# Patient Record
Sex: Male | Born: 1995
Health system: Southern US, Community
[De-identification: ages and names within clinical notes are randomized; demographics above are authoritative.]

## PROBLEM LIST (undated history)

## (undated) DIAGNOSIS — F319 Bipolar disorder, unspecified: Secondary | ICD-10-CM

## (undated) DIAGNOSIS — Z8659 Personal history of other mental and behavioral disorders: Secondary | ICD-10-CM

---
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
## 2017-01-01 NOTE — ED Notes (Signed)
Regular Diet was ordered for patient. 

## 2017-01-01 NOTE — ED Notes (Signed)
Regular Diet was ordered for Lunch. 
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
## 2017-01-01 NOTE — ED Notes (Signed)
Pt eating lunch tray  

## 2017-01-02 NOTE — ED Notes (Signed)
Breakfast tray ordered by Dee, NS 

## 2017-01-02 NOTE — ED Notes (Addendum)
Pt given saltines, peanut butter and sprite for snack

## 2017-01-02 NOTE — ED Notes (Signed)
Patient was given a snack and drink, A Regular diet was ordered for dinner.

## 2017-01-02 NOTE — ED Notes (Signed)
A cookies and a drink was placed at patient bedside.

## 2017-01-02 NOTE — ED Notes (Signed)
Regular Diet has been Ordered for Lunch. 

## 2017-01-02 NOTE — ED Notes (Signed)
Patient has received Lunch. 

## 2017-01-03 NOTE — ED Notes (Signed)
Pt ambulating to shower w/Sitter.

## 2017-01-04 NOTE — ED Notes (Addendum)
Pt given snack and sprite to drink 

## 2017-01-04 NOTE — ED Notes (Signed)
Lunch tray ordered 
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
## 2017-01-04 NOTE — ED Notes (Signed)
Lunch tray at bedside. ?

## 2017-01-05 DIAGNOSIS — F919 Conduct disorder, unspecified: Secondary | ICD-10-CM | POA: Diagnosis not present

## 2017-01-05 DIAGNOSIS — R4589 Other symptoms and signs involving emotional state: Secondary | ICD-10-CM | POA: Diagnosis not present

## 2017-01-05 DIAGNOSIS — R45851 Suicidal ideations: Secondary | ICD-10-CM | POA: Diagnosis not present

## 2017-01-05 DIAGNOSIS — F319 Bipolar disorder, unspecified: Secondary | ICD-10-CM | POA: Diagnosis not present

## 2017-01-05 MED ORDER — ZIPRASIDONE MESYLATE 20 MG IM SOLR
20.0000 mg | Freq: Once | INTRAMUSCULAR | Status: AC
  Administered 2017-01-05: 20 mg via INTRAMUSCULAR
  Filled 2017-01-05: qty 20

## 2017-01-05 MED ORDER — ACETAMINOPHEN 325 MG PO TABS
650.0000 mg | ORAL_TABLET | ORAL | Status: DC | PRN
  Administered 2017-01-05: 650 mg via ORAL
  Filled 2017-01-05: qty 2

## 2017-01-05 MED ORDER — STERILE WATER FOR INJECTION IJ SOLN
INTRAMUSCULAR | Status: AC
  Administered 2017-01-05: 07:00:00
  Filled 2017-01-05: qty 10

## 2017-01-05 NOTE — ED Notes (Signed)
Per Dahlia ClientHannah, SW, pt's [facility]. Advised pt should be receiving admission date by tomorrow (01/06/17) and hopefully will be able to be transported tomorrow.

## 2017-01-05 NOTE — ED Notes (Signed)
Pt to desk to ask for a pencil to draw with; this RN tried to give him crayons, and he refused.  A stray pencil was in the crayon box.  Pt fixated on pencil and wanted to draw with it.  To keep peace with this pt, he was given paper and pencil.

## 2017-01-05 NOTE — ED Notes (Signed)
Patient asking for trazodone at this time as another patient is disrupting the unit.

## 2017-01-05 NOTE — ED Notes (Addendum)
Pt asked for pencil back because he was twirling it in his fingers.  Pt saying he'd only give up pencil if someone took it from him.  Pt refused.  This RN informed patient of his plan of care (group home) and that doing anything drastic might change it.  Pt stated he didn't care.

## 2017-01-05 NOTE — ED Notes (Signed)
Pt lying on bed w/eyes closed. Respirations even, unlabored. Pt awakens easily. States "I feel fine." Pt able to move fingers/hands/wrists - normal color noted - no injury noted. Pt wearing burgundy paper scrubs. Pt noted w/hands in forensic restraints - [employer] at bedside. 24-Hour Emergency IVC paperwork completed by Dr Nicanor AlconPalumbo. Copy [facility] to [facility] - copy to Medical Records - Copy on clipboard - Original placed in folder for Magistrate.

## 2017-01-05 NOTE — ED Provider Notes (Signed)
Informed by nursing upon patient return that patient had eloped.  Neither Dr. Rhunette CroftNanavati nor I were aware that patient had eloped.  Brought back by [employer], informed patient was under IVC and this was renewed yesterday  Seen and examined  NCAT RRR CTAB NABS Alert    Christopher Barthold, MD 01/05/17 [phone]

## 2017-01-05 NOTE — ED Notes (Signed)
Pt resting quietly on bed w/eyes closed. Respirations even, unlabored. Pt able to move all extremities w/o difficulty when woke briefly by RN. Pt states "I feel fine".
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
## 2017-01-05 NOTE — ED Notes (Signed)
Pt ambulatory to F-01 w/Sitter.

## 2017-01-05 NOTE — ED Notes (Signed)
A Regular Diet was ordered for Lunch, and Patient was given a snack and drink. 
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
## 2017-01-05 NOTE — ED Notes (Signed)
Christopher Fermoina, NP, [facility] - aware of need for Telepsycy d/t pt now under 24-hour IVC.

## 2017-01-05 NOTE — ED Notes (Signed)
Christopher Foley, SW, called and updated RN on tx plan. Aware pt under Emergency (24-hour) IVC commitment - not 7-day IVC.

## 2017-01-05 NOTE — ED Notes (Addendum)
Forensic restraints removed.  Officers retrieved cuffs.

## 2017-01-05 NOTE — ED Notes (Signed)
Regular Diet was ordered for Dinner, and Patient was given a snack and drink.

## 2017-01-06 NOTE — Progress Notes (Signed)
CSW reached out to [place] via phone [phone]  from [facility] to get a update on placement for pt. A voicemail was left. CSW will continue to follow with needs.   Claude MangesKierra S. Alessandra Sawdey, MSW, LCSW-A Emergency Department Clinical Social Worker [phone]

## 2017-01-06 NOTE — Progress Notes (Signed)
CSW spoke with [facility]innie [facility]Gretchen at [facility] to get an update about pt. [name] informed CSW that she is still waiting for a call from the provider about an admission date and time.    Claude MangesKierra S. Epifanio Labrador, MSW, LCSW-A Emergency Department Clinical Social Worker [phone]

## 2017-01-07 NOTE — ED Notes (Signed)
Regular Diet was ordered for Dinner. 

## 2017-01-07 NOTE — ED Notes (Signed)
Patient was given Cookies and a Sprite. 

## 2017-01-07 NOTE — ED Notes (Signed)
Pt calm and cooperative, following treatment, sitter at the bedside.

## 2017-01-07 NOTE — ED Notes (Signed)
Dinner tray delivered to room. 

## 2017-01-07 NOTE — ED Notes (Signed)
Regular Diet was ordered for patient. 

## 2017-01-07 NOTE — ED Notes (Signed)
[facility]

## 2017-01-07 NOTE — ED Notes (Signed)
Pt's [place] start representative in room with CSW.

## 2017-01-08 NOTE — ED Notes (Signed)
Dinner order placed 

## 2017-01-08 NOTE — ED Notes (Signed)
PT ambulates to restroom. No requests at this time.

## 2017-01-08 NOTE — Progress Notes (Signed)
CSW was contacted by pt's [place] Start Worker Veterinary surgeon) regarding a time to come visit with pt. Crystal came on 01/07/17 and met with pt that evening to follow up with pts care. CSW attempted  to reach out to Betsey Amen  for another update regarding pt and placement day and time. No answer so CSW left VM. CSW will continue to follow up with needs as present.    Virgie Dad Peg Fifer, MSW, [facility] Emergency Department Clinical Social Worker [phone]

## 2017-01-08 NOTE — ED Notes (Signed)
Meal tray delivered.

## 2017-01-08 NOTE — Progress Notes (Signed)
CSW spoke with Vinnie LangtonGretchen via phone and was made aware that Vinnie LangtonGretchen is still waiting to hear from the Provider on an admission date and time for pt.    Claude MangesKierra S. Almeta Geisel, MSW, LCSW-A Emergency Department Clinical Social Worker [phone]

## 2017-01-08 NOTE — ED Notes (Signed)
Meal tray arrives.  

## 2017-01-08 NOTE — ED Notes (Signed)
Patient was given a Sprite and Cookies for Snack. 

## 2017-01-08 NOTE — ED Notes (Signed)
PT given phone to make a call. PT reports he is calling his mother at [phone]

## 2017-01-08 NOTE — ED Notes (Signed)
Regular Diet was ordered for Lunch. 

## 2017-01-09 NOTE — ED Notes (Signed)
Lying on bed watching tv. 

## 2017-01-09 NOTE — ED Notes (Signed)
Pt ambulatory to shower w/Sitter. 

## 2017-01-09 NOTE — ED Notes (Signed)
Pt sitting on bed watching tv. Offered for pt to play [place]enga - advised he would like to have them - given.

## 2017-01-09 NOTE — ED Notes (Signed)
Pt given sugar free cookies and sprite.

## 2017-01-10 MED ORDER — TOPIRAMATE 25 MG PO TABS
50.0000 mg | ORAL_TABLET | Freq: Every day | ORAL | Status: DC
  Administered 2017-01-10 – 2017-01-15 (×6): 50 mg via ORAL
  Filled 2017-01-10 (×6): qty 2

## 2017-01-10 MED ORDER — TOPIRAMATE 25 MG PO TABS
75.0000 mg | ORAL_TABLET | Freq: Every day | ORAL | Status: DC
  Administered 2017-01-11 – 2017-01-12 (×2): 75 mg via ORAL
  Filled 2017-01-10 (×2): qty 3

## 2017-01-10 NOTE — ED Notes (Signed)
Pt given snack. 

## 2017-01-10 NOTE — ED Notes (Signed)
Pharmacist adjusted dose times for Topamax d/t pt states takes before breakfast and dinner to decrease appetite.

## 2017-01-10 NOTE — ED Notes (Signed)
Ambulatory to shower w/Sitter.  

## 2017-01-10 NOTE — ED Notes (Signed)
Breakfast delivered  

## 2017-01-11 NOTE — Progress Notes (Addendum)
LCSW continues to follow for disposition and understanding of next steps with placement.  Message left for TerryGretchen with [facility]:  [phone].  Will follow up once message returned.  2:31 PM Due to no return call back from Care Coordinator, call placed to Supervisor:  Saverio DankerRonnie Wilks (supervisor for [place]ret[place]) [phone]: and message left for additional assistance.  Will continue to follow.   Late Entry:  Received call around 5:30pm from [place].  Reports she should have a time and date for admission.  Awaiting call this AM. Attempted to update CSW on campus, will follow up.   Christopher EmoryHannah Zyah Gomm LCSW, MSW Clinical Social Work: Optician, dispensingystem Wide Float Coverage for :  [phone]

## 2017-01-12 NOTE — ED Notes (Signed)
Cleaned patient room after shower

## 2017-01-12 NOTE — ED Notes (Signed)
LM for Dahlia ClientHannah, SW, to please return call.

## 2017-01-12 NOTE — Progress Notes (Addendum)
LCSW received call back from [place] reporting still no admission date/time.  Reports supervisor is now handing case and working to make contact with the group home as they have called and emailed daily with no response.  LCSW requested alternative options such as other placement considerations, however this was declined. [place] [facility] cannot take the patient as they must have and admit date before accepting him into crisis bed.   Saverio DankerRonnie Foley is following case and will contact LCSW regarding plan.   LCSW will notify and update leadership. Call placed to medical director Christopher Foley, updated on barriers and Christopher Foley is calling Christopher Foley with regards to extended stay in the ED and [facility].  LCSW will follow up with recommendations from the [facility].  Christopher EmoryHannah Makynli Stills LCSW, MSW Clinical Social Work: Optician, dispensingystem Wide Float Coverage for :  [phone]

## 2017-01-13 NOTE — ED Notes (Signed)
Pt calm and cooperative. Watching TV and eating dinner. Sitter at bedside.

## 2017-01-13 NOTE — ED Notes (Addendum)
Pt is resting at this time, occasionally waking and watching TV. Has been calm and cooperative throughout the shift. Sitter at bedside.

## 2017-01-13 NOTE — ED Notes (Addendum)
Pt's blood pressure and pulse values noted.  This RN went back through the chart and found similar values at similar times of the day.  Pt is A&Ox4 with no other complaints at this time.  Sitter at bedside talking to pt.

## 2017-01-13 NOTE — Progress Notes (Signed)
LCSW still following for placement:  Received call from leadership regarding State's plan and [facility]'s IDD coordinator follow up on longevity and other placement options for patient due to group home not responding.  At this time, awaiting call from [facility]/State to address next steps.  Deretha EmoryHannah Anhthu Perdew LCSW, MSW Clinical Social Work: Optician, dispensingystem Wide Float Coverage for :  [phone]

## 2017-01-13 NOTE — ED Notes (Signed)
Meal delivered

## 2017-01-14 NOTE — ED Triage Notes (Signed)
Christopher LangtonGretchen is Pt's Complex  Care  Coordinator . Her Tel.# [phone].

## 2017-01-14 NOTE — ED Triage Notes (Signed)
Pt has a Complex Care Coordinator name is Fish farm managerGretchen . Vinnie LangtonGretchen is looking for a group home for PT.

## 2017-01-14 NOTE — Progress Notes (Addendum)
CSW attempted to reach Saverio DankerRonnie Wilks [phone]) to get further update on pt and plan for pt's placement. CSW spoke with leadership and was advised to update Ronnie on phone calls that had been placed. CSW was unable to leave message for [facility] due to mailbox being full at this time. CSW also contacted Vinnie LangtonGretchen but no answer, VM was left for call to be returned.   Claude MangesKierra S. Densil Ottey, MSW, LCSW-A Emergency Department Clinical Social Worker [phone]

## 2017-01-14 NOTE — ED Notes (Signed)
Pt slept throughout the night with out complaints.

## 2017-01-15 MED ORDER — TOPIRAMATE 50 MG PO TABS: 50.0000 mg | ORAL_TABLET | Freq: Two times a day (BID) | ORAL | 1 refills | Status: DC

## 2017-01-15 MED ORDER — TOPIRAMATE 25 MG PO TABS: ORAL_TABLET | ORAL | 0 refills | Status: DC

## 2017-01-15 MED ORDER — TOPIRAMATE 25 MG PO TABS: 25.0000 mg | ORAL_TABLET | Freq: Every morning | ORAL | 1 refills | Status: DC

## 2017-01-15 MED ORDER — TRAZODONE HCL 50 MG PO TABS: 50.0000 mg | ORAL_TABLET | Freq: Every day | ORAL | 0 refills | Status: DC

## 2017-01-15 MED ORDER — HYDROXYZINE HCL 25 MG PO TABS: 50.0000 mg | ORAL_TABLET | Freq: Three times a day (TID) | ORAL | 0 refills | Status: DC | PRN

## 2017-01-15 MED ORDER — ARIPIPRAZOLE 10 MG PO TABS: 10.0000 mg | ORAL_TABLET | Freq: Every day | ORAL | 1 refills | Status: DC

## 2017-01-15 MED ORDER — HYDROXYZINE HCL 25 MG PO TABS: 25.0000 mg | ORAL_TABLET | Freq: Three times a day (TID) | ORAL | 0 refills | Status: DC | PRN

## 2017-01-15 MED ORDER — ARIPIPRAZOLE 10 MG PO TABS: 10.0000 mg | ORAL_TABLET | Freq: Every evening | ORAL | 0 refills | Status: DC

## 2017-01-15 MED FILL — ARIPiprazole 10 MG TABS: 10 | 30 days supply | Qty: 30 | Fill #0

## 2017-01-15 MED FILL — TOPIRAMATE 25 MG TABLET: 25 | 30 days supply | Qty: 150 | Fill #0

## 2017-01-15 MED FILL — traZODone HCL 50 MG TABS: 50 | 30 days supply | Qty: 30 | Fill #0

## 2017-01-15 MED FILL — hydrOXYzine HCL 25 MG TABS: 25 | 15 days supply | Qty: 90 | Fill #0

## 2017-01-15 NOTE — Progress Notes (Signed)
CSW spoke with Vinnie LangtonGretchen via phone regarding placement for pt. CSW was informed that pt could [facility] be [facility] [place], but would have to contact CSW back once more information was given. CSW still awaiting plan for discharge at this time.    Claude MangesKierra S. Ramsha Lonigro, MSW, LCSW-A Emergency Department Clinical Social Worker [phone]

## 2017-01-15 NOTE — ED Triage Notes (Signed)
TC to Pediatric unit to clear Pt to come to play room. Staff reported the Play therapist was not working today .

## 2017-01-15 NOTE — Progress Notes (Signed)
CSW received a call from [place] on 01/14/17 informing CSW that She was on her way to speak with pt at bedside. CSW met with Christopher Foley as well. [name] informed CSW that at this time, pt's bed had been given away and that Christopher Foley was working with another potential [place] for placement, CSW notified Jarrett Soho (LCSW) and Dr. Reynaldo Minium of this. CSW is still waiting to hear from [place] on further placement for pt at this time.    Christopher Foley, MSW, [place] Emergency Department Clinical Social Worker [phone]

## 2017-01-15 NOTE — Progress Notes (Signed)
CSW spoke with pt's brother Gerlene BurdockRichard [phone] to confirm that brother would be able to bring pt's belongings to the group home over the weekend. CSW contacted group home to notify them that pt will be coming this evening.   Claude MangesKierra S. Garlan Drewes, MSW, LCSW-A Emergency Department Clinical Social Worker [phone]

## 2017-01-15 NOTE — Progress Notes (Signed)
CSW spoke with a number of people regarding placement for pt at this time. Per Christopher Foley, pt will be discharged to [facility] at [address], [place], [place] [address]. CSW spoke with Christopher Foley (owener of group home) and confirmed that pt was able to come this evening. Ownerconfirmed that pt could come, theretofore pt will be transported to the Group Home via [employer].CSW contacted pt's mother Christopher Houseman([name]) to get permission for [employer] to transport pt to the Group Home, in which mother agreed to this. Pt will be provided medications to take with pt to group home at the time of discharge. No further needs are present at this time.    Christopher Foley, MSW, LCSW-A Emergency Department Clinical Social Worker [phone]

## 2017-01-15 NOTE — ED Provider Notes (Signed)
  Physical Exam  BP (!) 117/48 (BP Location: Left Arm)   Pulse (!) 58   Temp 98.2 F (36.8 C) (Oral)   Resp 18   SpO2 99%   Physical Exam  ED Course  Procedures  MDM Patient still pending placement. Medically cleared previously    Charlynne PanderYao, David Hsienta, MD 01/15/17 [phone]

## 2017-01-15 NOTE — ED Notes (Signed)
This RN spoke with Peds. As long as pt is accompanied by sitter and security pt may use activity room.  Pt interested, agrees to plan of going after lunch. Jeanice LimHolly, Consulting civil engineercharge RN, aware.

## 2017-01-15 NOTE — Discharge Instructions (Signed)
Go to group home now

## 2017-01-15 NOTE — ED Notes (Signed)
Pt requesting something to help him sleep, given Trazadone per PRN order.

## 2017-01-15 NOTE — ED Triage Notes (Signed)
Meal  Delivered .

## 2017-02-05 ENCOUNTER — Encounter (HOSPITAL_COMMUNITY): Payer: Self-pay | Admitting: *Deleted

## 2017-02-05 ENCOUNTER — Emergency Department (HOSPITAL_COMMUNITY)
Admission: EM | Admit: 2017-02-05 | Discharge: 2017-02-05 | Disposition: A | Discharge status: Home / Self Care | Payer: Medicaid Other | Attending: Emergency Medicine | Admitting: Emergency Medicine

## 2017-02-05 DIAGNOSIS — F319 Bipolar disorder, unspecified: Secondary | ICD-10-CM | POA: Diagnosis not present

## 2017-02-05 DIAGNOSIS — Z760 Encounter for issue of repeat prescription: Secondary | ICD-10-CM | POA: Insufficient documentation

## 2017-02-05 MED ORDER — ARIPIPRAZOLE 10 MG PO TABS: ORAL_TABLET | ORAL | 0 refills | Status: DC

## 2017-02-05 MED ORDER — TOPIRAMATE 25 MG PO TABS: ORAL_TABLET | ORAL | 0 refills | Status: DC

## 2017-02-05 MED ORDER — TRAZODONE HCL 50 MG PO TABS: 50.0000 mg | ORAL_TABLET | Freq: Every day | ORAL | 0 refills | Status: DC

## 2017-02-05 MED ORDER — HYDROXYZINE HCL 25 MG PO TABS: 50.0000 mg | ORAL_TABLET | Freq: Three times a day (TID) | ORAL | 0 refills | Status: DC

## 2017-02-05 NOTE — ED Provider Notes (Signed)
[facility]EMERGENCY DEPT Provider Note    By signing my name below, I, Earmon Phoenix, attest that this documentation has been prepared under the direction and in the presence of Nikhil Osei, PA-C. Electronically Signed: Earmon Phoenix, ED Scribe. 02/05/17. 1:04 PM.    History   Chief Complaint Chief Complaint  Patient presents with  . Medication Refill    The history is provided by the patient and medical records. No language interpreter was used.    Christopher Foley is a 21 y.o. male who presents to the Emergency Department needing a refill on his medications, Hydroxyzine, Trazodone and Abilify. He and his caregiver deny any complaints at this time. He was discharged from the hospital with a 30 day supply of medications 3 weeks ago on 01/15/17 and has an appt for follow up in one week. There are no modifying factors. He denies any complaints or any other problems at this time.   Past Medical History:  Diagnosis Date  . Bipolar 1 disorder (HCC)   . History of ADHD     Patient Active Problem List   Diagnosis Date Noted  . Conduct disorder, childhood-onset type 02/09/2013  . ADHD (attention deficit hyperactivity disorder), combined type 02/08/2013  . Bipolar 1 disorder, mixed, moderate (HCC) 01/15/2013    History reviewed. No pertinent surgical history.     Home Medications    Prior to Admission medications   Medication Sig Start Date End Date Taking? Authorizing Provider  ARIPiprazole (ABILIFY) 10 MG tablet Take 1 tablet (10 mg total) by mouth every evening. 01/15/17   Garlon Hatchet, PA-C  ARIPiprazole (ABILIFY) 10 MG tablet Take 1 tablet (10 mg total) by mouth daily. At bedtime 01/15/17   Donnetta Hutching, MD  chlorproMAZINE (THORAZINE) 200 MG tablet Take 1 tablet (200 mg total) by mouth at bedtime. Patient not taking: Reported on 12/27/2016 02/17/13   Chauncey Mann, MD  cloNIDine HCl (KAPVAY) 0.1 MG TB12 ER tablet Take 1 tablet (0.1 mg total) by mouth 2 (two) times  daily in the am and at bedtime.. taking 2 tablets (0.2 mg total) at bedtime Patient not taking: Reported on 12/27/2016 02/17/13   Chauncey Mann, MD  desmopressin (DDAVP) 0.2 MG tablet Take 1 tablet (0.2 mg total) by mouth at bedtime. Patient not taking: Reported on 12/27/2016 02/17/13   Chauncey Mann, MD  hydrOXYzine (ATARAX/VISTARIL) 25 MG tablet Take 2 tablets (50 mg total) by mouth 3 (three) times daily as needed for anxiety. 01/15/17   Garlon Hatchet, PA-C  hydrOXYzine (ATARAX/VISTARIL) 25 MG tablet Take 1 tablet (25 mg total) by mouth 3 (three) times daily as needed. Take two tabs tid prn anxiety 01/15/17   Donnetta Hutching, MD  loratadine (CLARITIN) 10 MG tablet Take 1 tablet (10 mg total) by mouth daily. Patient not taking: Reported on 12/27/2016 02/17/13   Chauncey Mann, MD  OLANZapine (ZYPREXA) 10 MG tablet Take 1 tablet (10 mg total) by mouth 2 (two) times daily. At morning and evening meal Patient not taking: Reported on 12/27/2016 02/17/13   Chauncey Mann, MD  topiramate (TOPAMAX) 25 MG tablet Take 75mg  by mouth in the morning and 50mg  by mouth in the evening. 01/15/17   Garlon Hatchet, PA-C  topiramate (TOPAMAX) 25 MG tablet Take 1 tablet (25 mg total) by mouth every morning. Take with other morning dose of Topamax 50 mg 01/15/17   Donnetta Hutching, MD  topiramate (TOPAMAX) 50 MG tablet Take 1 tablet (50 mg total) by mouth  2 (two) times daily. 01/15/17   Donnetta Hutching, MD  traZODone (DESYREL) 50 MG tablet Take 1 tablet (50 mg total) by mouth at bedtime. 01/15/17   Garlon Hatchet, PA-C    Family History History reviewed. No pertinent family history.  Social History Social History  Substance Use Topics  . Smoking status: Never Smoker  . Smokeless tobacco: Never Used  . Alcohol use No     Allergies   Patient has no known allergies.   Review of Systems Review of Systems  Constitutional: Negative for chills and fever.  Gastrointestinal: Negative for nausea and vomiting.    Psychiatric/Behavioral: Negative for behavioral problems.       Needs med refills     Physical Exam Updated Vital Signs BP (!) 149/72 (BP Location: Left Arm)   Pulse 60   Temp 98.2 F (36.8 C) (Oral)   Resp 18   SpO2 100%   Physical Exam  Constitutional: He is oriented to person, place, and time. He appears well-developed and well-nourished.  HENT:  Head: Normocephalic and atraumatic.  Neck: Normal range of motion.  Cardiovascular: Normal rate.   Pulmonary/Chest: Effort normal.  Musculoskeletal: Normal range of motion.  Neurological: He is alert and oriented to person, place, and time.  Skin: Skin is warm and dry.  Psychiatric: He has a normal mood and affect. His behavior is normal.  Nursing note and vitals reviewed.    ED Treatments / Results  DIAGNOSTIC STUDIES: Oxygen Saturation is 100% on RA, normal by my interpretation.   COORDINATION OF CARE: 1:03 PM- Will refill medications and encouraged follow up in one week. Pt verbalizes understanding and agrees to plan.  Medications - No data to display  Labs (all labs ordered are listed, but only abnormal results are displayed) Labs Reviewed - No data to display  EKG  EKG Interpretation None       Radiology No results found.  Procedures Procedures (including critical care time)  Medications Ordered in ED Medications - No data to display   Initial Impression / Assessment and Plan / ED Course  I have reviewed the triage vital signs and the nursing notes.  Pertinent labs & imaging results that were available during my care of the patient were reviewed by me and considered in my medical decision making (see chart for details).     Patient NAD with caregiver for refill of his medications for his psychiatric illness. They are worried that he will run out before his appointment which is 1 week from today. Patient is currently at a group home and was discharged there with prescriptions 3 weeks ago. Patient has  no complaints. Patient is in no acute distress on exam. I will give them a refill for his prescriptions, however discussed to make sure to follow-up for his further medication refill and evaluation by psychiatrist to make sure medications are working and no adjustments need to be made. Patient's caregiver voiced understanding and agreed to the plan.   Vitals:   02/05/17 1226  BP: (!) 149/72  Pulse: 60  Resp: 18  Temp: 98.2 F (36.8 C)  TempSrc: Oral  SpO2: 100%      Final Clinical Impressions(s) / ED Diagnoses   Final diagnoses:  Medication refill    New Prescriptions New Prescriptions   ARIPIPRAZOLE (ABILIFY) 10 MG TABLET    Take one tab PO QHS   HYDROXYZINE (ATARAX/VISTARIL) 25 MG TABLET    Take 2 tablets (50 mg total) by mouth 3 (three) times  daily.   TOPIRAMATE (TOPAMAX) 25 MG TABLET    Take 3 tab PO in AM, take 2 tabs PO in evening   TRAZODONE (DESYREL) 50 MG TABLET    Take 1 tablet (50 mg total) by mouth at bedtime.    I personally performed the services described in this documentation, which was scribed in my presence. The recorded information has been reviewed and is accurate.      Jaynie CrumbleKirichenko, Minahil Quinlivan, PA-C 02/05/17 1316    Melene PlanFloyd, Dan, DO 02/05/17 1404

## 2017-02-05 NOTE — Discharge Instructions (Signed)
Please follow up as scheduled next week. Continue regular medications.

## 2017-02-05 NOTE — ED Triage Notes (Signed)
Pt was seen here recently and discharged to a group home. They were told he would be discharged with prescriptions for 30 day supply of his meds but was not. Here strictly for refills. Denies any other complaints.

## 2017-02-05 NOTE — ED Notes (Signed)
Pt from group home with care taker. Requesting refill on all meds. List provided.

## 2017-03-27 ENCOUNTER — Encounter (HOSPITAL_COMMUNITY): Payer: Self-pay | Admitting: Emergency Medicine

## 2017-03-27 ENCOUNTER — Emergency Department (HOSPITAL_COMMUNITY)
Admission: EM | Admit: 2017-03-27 | Discharge: 2017-04-19 | Disposition: A | Discharge status: Home / Self Care | Payer: Medicaid Other | Attending: Emergency Medicine | Admitting: Emergency Medicine

## 2017-03-27 DIAGNOSIS — F3162 Bipolar disorder, current episode mixed, moderate: Secondary | ICD-10-CM | POA: Diagnosis present

## 2017-03-27 DIAGNOSIS — F919 Conduct disorder, unspecified: Secondary | ICD-10-CM | POA: Diagnosis not present

## 2017-03-27 DIAGNOSIS — F6381 Intermittent explosive disorder: Secondary | ICD-10-CM | POA: Diagnosis not present

## 2017-03-27 DIAGNOSIS — Z6222 Institutional upbringing: Secondary | ICD-10-CM | POA: Diagnosis not present

## 2017-03-27 DIAGNOSIS — R4589 Other symptoms and signs involving emotional state: Secondary | ICD-10-CM | POA: Diagnosis present

## 2017-03-27 DIAGNOSIS — R4689 Other symptoms and signs involving appearance and behavior: Secondary | ICD-10-CM

## 2017-03-27 DIAGNOSIS — Z79899 Other long term (current) drug therapy: Secondary | ICD-10-CM | POA: Insufficient documentation

## 2017-03-27 DIAGNOSIS — F902 Attention-deficit hyperactivity disorder, combined type: Secondary | ICD-10-CM | POA: Diagnosis not present

## 2017-03-27 DIAGNOSIS — F911 Conduct disorder, childhood-onset type: Secondary | ICD-10-CM | POA: Diagnosis present

## 2017-03-27 LAB — RAPID URINE DRUG SCREEN, HOSP PERFORMED
Amphetamines: NOT DETECTED
Barbiturates: NOT DETECTED
Benzodiazepines: NOT DETECTED
Cocaine: NOT DETECTED
OPIATES: NOT DETECTED
TETRAHYDROCANNABINOL: NOT DETECTED

## 2017-03-27 LAB — COMPREHENSIVE METABOLIC PANEL
ALBUMIN: 4.2 g/dL (ref 3.5–5.0)
ALK PHOS: 82 U/L (ref 38–126)
ALT: 15 U/L — ABNORMAL LOW (ref 17–63)
ANION GAP: 10 (ref 5–15)
AST: 20 U/L (ref 15–41)
BUN: 12 mg/dL (ref 6–20)
CALCIUM: 9.6 mg/dL (ref 8.9–10.3)
CO2: 23 mmol/L (ref 22–32)
Chloride: 106 mmol/L (ref 101–111)
Creatinine, Ser: 0.91 mg/dL (ref 0.61–1.24)
GFR calc Af Amer: >60 mL/min (ref >60)
GLUCOSE: 84 mg/dL (ref 65–99)
POTASSIUM: 4 mmol/L (ref 3.5–5.1)
Sodium: 139 mmol/L (ref 135–145)
Total Bilirubin: 1 mg/dL (ref 0.3–1.2)
Total Protein: 6.9 g/dL (ref 6.5–8.1)

## 2017-03-27 LAB — CBC
HEMATOCRIT: 39.2 % (ref 39.0–52.0)
Hemoglobin: 12.9 g/dL — ABNORMAL LOW (ref 13.0–17.0)
MCH: 21.6 pg — AB (ref 26.0–34.0)
MCHC: 32.9 g/dL (ref 30.0–36.0)
MCV: 65.8 fL — AB (ref 78.0–100.0)
PLATELETS: 113 10*3/uL — AB (ref 150–400)
RBC: 5.96 MIL/uL — ABNORMAL HIGH (ref 4.22–5.81)
RDW: 16.3 % — ABNORMAL HIGH (ref 11.5–15.5)
WBC: 6.5 10*3/uL (ref 4.0–10.5)

## 2017-03-27 LAB — SALICYLATE LEVEL: Salicylate Lvl: <7 mg/dL (ref 2.8–30.0)

## 2017-03-27 LAB — ETHANOL

## 2017-03-27 LAB — ACETAMINOPHEN LEVEL: Acetaminophen (Tylenol), Serum: <10 ug/mL — ABNORMAL LOW (ref 10–30)

## 2017-03-27 MED ORDER — ONDANSETRON HCL 4 MG PO TABS: 4.0000 mg | ORAL_TABLET | Freq: Three times a day (TID) | ORAL | Status: DC | PRN

## 2017-03-27 MED ORDER — HYDROXYZINE HCL 25 MG PO TABS: 50.0000 mg | ORAL_TABLET | Freq: Three times a day (TID) | ORAL | Status: DC | PRN

## 2017-03-27 MED ORDER — TRAZODONE HCL 50 MG PO TABS
50.0000 mg | ORAL_TABLET | Freq: Every day | ORAL | Status: DC
  Administered 2017-03-27: 50 mg via ORAL
  Filled 2017-03-27: qty 1

## 2017-03-27 MED ORDER — TOPIRAMATE 25 MG PO TABS
50.0000 mg | ORAL_TABLET | Freq: Every day | ORAL | Status: DC
  Administered 2017-03-27: 50 mg via ORAL
  Filled 2017-03-27: qty 2

## 2017-03-27 MED ORDER — ALUM & MAG HYDROXIDE-SIMETH 200-200-20 MG/5ML PO SUSP: 30.0000 mL | Freq: Four times a day (QID) | ORAL | Status: DC | PRN

## 2017-03-27 MED ORDER — ACETAMINOPHEN 325 MG PO TABS
650.0000 mg | ORAL_TABLET | ORAL | Status: DC | PRN
Start: 2017-03-27 — End: 2017-04-19

## 2017-03-27 MED ORDER — TOPIRAMATE 25 MG PO TABS
75.0000 mg | ORAL_TABLET | Freq: Every day | ORAL | Status: DC
  Filled 2017-03-27: qty 3

## 2017-03-27 MED ORDER — ARIPIPRAZOLE 10 MG PO TABS
10.0000 mg | ORAL_TABLET | Freq: Every day | ORAL | Status: DC
  Filled 2017-03-27: qty 1
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
## 2017-03-27 NOTE — ED Notes (Signed)
Hourly rounding reveals patient sleeping in room. No complaints, stable, in no acute distress. Q15 minute rounds and monitoring via Security Cameras to continue. 

## 2017-03-27 NOTE — BH Assessment (Addendum)
[facility] Assessment Progress Note  TTS attempted to contact the petitioner of the IVC in order to obtain collateral information. There is no telephone number on the IVC for the petitioner. TTS attempted to contact the pt's legal guardian, Christopher Foley (mother) [phone] but did not receive an answer. A HIPAA compliant voicemail was left for the pt's legal guardian requesting a return call. TTS attempted to locate an alternate telephone number for the pt's group Foley which according to the chart documentation at 01/15/2017 5:22 PM by CSW, pt's group Foley is "Christopher Foley" but was unsuccessful in locating a contact telephone number for the group Foley.   Princess Bruins, MSW, LCSW TTS Specialist [phone]

## 2017-03-27 NOTE — ED Notes (Signed)
Pt. Transferred to [facility] from ED to room 36 after screening for contraband. Report to include Situation, Background, Assessment and Recommendations from Junior RN. Pt. Oriented to unit including Q15 minute rounds as well as the security cameras for their protection. Patient is alert and oriented, warm and dry in no acute distress. Patient denies SI, HI, and AVH. Pt. Encouraged to let me know if needs arise.

## 2017-03-27 NOTE — ED Provider Notes (Signed)
[facility]-EMERGENCY DEPT Provider Note   CSN: [id number] Arrival date & time: 03/27/17  1635     History   Chief Complaint No chief complaint on file.   HPI Christopher Foley is a 21 y.o. male.  21 yo M with a chief complaints of aggressive behavior. The patient got angry at his group home because they wouldn't take him to the gym. He then started destroying things with a knife. He was IVCD for concern of danger to himself and others. On arrival the patient denies any suicidal or homicidal ideation. He said he is angry because the situation but not has resolved. He denies cough congestion fevers chills abdominal pain nausea vomiting or diarrhea.   The history is provided by the patient and the police.  Illness  This is a chronic problem. The current episode started 3 to 5 hours ago. The problem occurs constantly. The problem has been resolved. Pertinent negatives include no chest pain, no abdominal pain, no headaches and no shortness of breath. Nothing aggravates the symptoms. Nothing relieves the symptoms. He has tried nothing for the symptoms. The treatment provided no relief.    Past Medical History:  Diagnosis Date  . Bipolar 1 disorder (HCC)   . History of ADHD     Patient Active Problem List   Diagnosis Date Noted  . Conduct disorder, childhood-onset type 02/09/2013  . ADHD (attention deficit hyperactivity disorder), combined type 02/08/2013  . Bipolar 1 disorder, mixed, moderate (HCC) 01/15/2013    History reviewed. No pertinent surgical history.     Home Medications    Prior to Admission medications   Medication Sig Start Date End Date Taking? Authorizing Provider  B Complex Vitamins (VITAMIN B COMPLEX PO) Take 1 tablet by mouth daily.   Yes [provider]  hydrOXYzine (ATARAX/VISTARIL) 25 MG tablet Take 1 tablet (25 mg total) by mouth 3 (three) times daily as needed. Take two tabs tid prn anxiety 01/15/17  Yes Donnetta Hutching, MD  traZODone (DESYREL) 50 MG tablet  Take 1 tablet (50 mg total) by mouth at bedtime. 01/15/17  Yes Garlon Hatchet, PA-C  ARIPiprazole (ABILIFY) 10 MG tablet Take 1 tablet (10 mg total) by mouth every evening. Patient not taking: Reported on 03/27/2017 01/15/17   Garlon Hatchet, PA-C  ARIPiprazole (ABILIFY) 10 MG tablet Take 1 tablet (10 mg total) by mouth daily. At bedtime Patient not taking: Reported on 03/27/2017 01/15/17   Donnetta Hutching, MD  ARIPiprazole (ABILIFY) 10 MG tablet Take one tab PO QHS Patient not taking: Reported on 03/27/2017 02/05/17   Jaynie Crumble, PA-C  chlorproMAZINE (THORAZINE) 200 MG tablet Take 1 tablet (200 mg total) by mouth at bedtime. Patient not taking: Reported on 03/27/2017 02/17/13   Chauncey Mann, MD  cloNIDine HCl (KAPVAY) 0.1 MG TB12 ER tablet Take 1 tablet (0.1 mg total) by mouth 2 (two) times daily in the am and at bedtime.. taking 2 tablets (0.2 mg total) at bedtime Patient not taking: Reported on 03/27/2017 02/17/13   Chauncey Mann, MD  desmopressin (DDAVP) 0.2 MG tablet Take 1 tablet (0.2 mg total) by mouth at bedtime. Patient not taking: Reported on 03/27/2017 02/17/13   Chauncey Mann, MD  hydrOXYzine (ATARAX/VISTARIL) 25 MG tablet Take 2 tablets (50 mg total) by mouth 3 (three) times daily as needed for anxiety. Patient not taking: Reported on 03/27/2017 01/15/17   Garlon Hatchet, PA-C  hydrOXYzine (ATARAX/VISTARIL) 25 MG tablet Take 2 tablets (50 mg total) by mouth 3 (three)  times daily. Patient not taking: Reported on 03/27/2017 02/05/17   Jaynie Crumble, PA-C  loratadine (CLARITIN) 10 MG tablet Take 1 tablet (10 mg total) by mouth daily. Patient not taking: Reported on 03/27/2017 02/17/13   Chauncey Mann, MD  OLANZapine (ZYPREXA) 10 MG tablet Take 1 tablet (10 mg total) by mouth 2 (two) times daily. At morning and evening meal Patient not taking: Reported on 03/27/2017 02/17/13   Chauncey Mann, MD  topiramate (TOPAMAX) 25 MG tablet Take  by mouth in the morning and   by mouth in the evening. Patient not taking: Reported on 03/27/2017 01/15/17   Garlon Hatchet, PA-C  topiramate (TOPAMAX) 25 MG tablet Take 1 tablet (25 mg total) by mouth every morning. Take with other morning dose of Topamax 50 mg Patient not taking: Reported on 03/27/2017 01/15/17   Donnetta Hutching, MD  topiramate (TOPAMAX) 25 MG tablet Take 3 tab PO in AM, take 2 tabs PO in evening Patient not taking: Reported on 03/27/2017 02/05/17   Jaynie Crumble, PA-C  topiramate (TOPAMAX) 50 MG tablet Take 1 tablet (50 mg total) by mouth 2 (two) times daily. Patient not taking: Reported on 03/27/2017 01/15/17   Donnetta Hutching, MD  traZODone (DESYREL) 50 MG tablet Take 1 tablet (50 mg total) by mouth at bedtime. Patient not taking: Reported on 03/27/2017 02/05/17   Jaynie Crumble, PA-C    Family History No family history on file.  Social History Social History  Substance Use Topics  . Smoking status: Never Smoker  . Smokeless tobacco: Never Used  . Alcohol use No     Allergies   Patient has no known allergies.   Review of Systems Review of Systems  Constitutional: Negative for chills and fever.  HENT: Negative for congestion and facial swelling.   Eyes: Negative for discharge and visual disturbance.  Respiratory: Negative for shortness of breath.   Cardiovascular: Negative for chest pain and palpitations.  Gastrointestinal: Negative for abdominal pain, diarrhea and vomiting.  Musculoskeletal: Negative for arthralgias and myalgias.  Skin: Negative for color change and rash.  Neurological: Negative for tremors, syncope and headaches.  Psychiatric/Behavioral: Positive for agitation and behavioral problems. Negative for confusion and dysphoric mood.     Physical Exam Updated Vital Signs BP (!) 146/80 (BP Location: Right Arm)   Pulse (!) 52   Temp 97.8 F (36.6 C) (Oral)   Resp 18   SpO2 100%   Physical Exam  Constitutional: He is oriented to person, place, and time. He appears  well-developed and well-nourished.  HENT:  Head: Normocephalic and atraumatic.  Eyes: Pupils are equal, round, and reactive to light. EOM are normal.  Neck: Normal range of motion. Neck supple. No JVD present.  Cardiovascular: Normal rate and regular rhythm.  Exam reveals no gallop and no friction rub.   No murmur heard. Pulmonary/Chest: No respiratory distress. He has no wheezes.  Abdominal: He exhibits no distension and no mass. There is no tenderness. There is no rebound and no guarding.  Musculoskeletal: Normal range of motion.  Neurological: He is alert and oriented to person, place, and time.  Skin: No rash noted. No pallor.  Psychiatric: He has a normal mood and affect. His behavior is normal.  Nursing note and vitals reviewed.    ED Treatments / Results  Labs (all labs ordered are listed, but only abnormal results are displayed) Labs Reviewed  COMPREHENSIVE METABOLIC PANEL - Abnormal; Notable for the following:       Result Value  ALT 15 (*)    All other components within normal limits  ACETAMINOPHEN LEVEL - Abnormal; Notable for the following:    Acetaminophen (Tylenol), Serum <10 (*)    All other components within normal limits  CBC - Abnormal; Notable for the following:    RBC 5.96 (*)    Hemoglobin 12.9 (*)    MCV 65.8 (*)    MCH 21.6 (*)    RDW 16.3 (*)    Platelets 113 (*)    All other components within normal limits  ETHANOL  SALICYLATE LEVEL  RAPID URINE DRUG SCREEN, HOSP PERFORMED    EKG  EKG Interpretation None       Radiology No results found.  Procedures Procedures (including critical care time)  Medications Ordered in ED Medications  acetaminophen (TYLENOL) tablet 650 mg (not administered)  ondansetron (ZOFRAN) tablet 4 mg (not administered)  alum & mag hydroxide-simeth (MAALOX/MYLANTA) 200-200-20 MG/5ML suspension 30 mL (not administered)  ARIPiprazole (ABILIFY) tablet 10 mg (10 mg Oral Not Given 03/27/17 1932)  hydrOXYzine  (ATARAX/VISTARIL) tablet 50 mg (not administered)  topiramate (TOPAMAX) tablet 75 mg (75 mg Oral Not Given 03/27/17 1932)  topiramate (TOPAMAX) tablet 50 mg (50 mg Oral Given 03/27/17 2203)  traZODone (DESYREL) tablet 50 mg (50 mg Oral Given 03/27/17 2203)     Initial Impression / Assessment and Plan / ED Course  I have reviewed the triage vital signs and the nursing notes.  Pertinent labs & imaging results that were available during my care of the patient were reviewed by me and considered in my medical decision making (see chart for details).     21 yo M With a situational episode that made him angry. This is now resolved. Since he was IVCD will have TTS evaluate. I do not feel labs are necessary. I feel he is medically clear. TTS eval, will keep overnight and reassess in AM.  The patients results and plan were reviewed and discussed.   Any x-rays performed were independently reviewed by myself.   Differential diagnosis were considered with the presenting HPI.  Medications  acetaminophen (TYLENOL) tablet 650 mg (not administered)  ondansetron (ZOFRAN) tablet 4 mg (not administered)  alum & mag hydroxide-simeth (MAALOX/MYLANTA) 200-200-20 MG/5ML suspension 30 mL (not administered)  ARIPiprazole (ABILIFY) tablet 10 mg (10 mg Oral Not Given 03/27/17 1932)  hydrOXYzine (ATARAX/VISTARIL) tablet 50 mg (not administered)  topiramate (TOPAMAX) tablet 75 mg (75 mg Oral Not Given 03/27/17 1932)  topiramate (TOPAMAX) tablet 50 mg (50 mg Oral Given 03/27/17 2203)  traZODone (DESYREL) tablet 50 mg (50 mg Oral Given 03/27/17 2203)    Vitals:   03/27/17 1910  BP: (!) 146/80  Pulse: (!) 52  Resp: 18  Temp: 97.8 F (36.6 C)  TempSrc: Oral  SpO2: 100%    Final diagnoses:  Aggressive behavior     Final Clinical Impressions(s) / ED Diagnoses   Final diagnoses:  Aggressive behavior    New Prescriptions New Prescriptions   No medications on file     Melene Plan, DO 03/27/17 2248

## 2017-03-27 NOTE — BH Assessment (Signed)
[facility] Assessment Progress Note  Per [name], NP pt is recommended for overnight observation for safety measures and to continue monitor due to hx of aggression, threats made according to the IVC, and pt's impulsive behaviors evidenced by grabbing knives today and making threats to kill group home staff. Jillyn Hidden, RN notified of recommendation. EDP Melene Plan, DO notified of recommendation.   Princess Bruins, MSW, LCSW TTS Specialist [phone]

## 2017-03-27 NOTE — ED Notes (Signed)
Bed: WA31 Expected date:  Expected time:  Means of arrival:  Comments: 

## 2017-03-27 NOTE — ED Triage Notes (Signed)
Pt brought in by [employer], IVD'd by group home for aggressive behavior. Group hoem states pt is a danger to himself and others

## 2017-03-28 DIAGNOSIS — F6381 Intermittent explosive disorder: Secondary | ICD-10-CM

## 2017-03-28 DIAGNOSIS — Z6222 Institutional upbringing: Secondary | ICD-10-CM | POA: Diagnosis not present

## 2017-03-28 DIAGNOSIS — Z79899 Other long term (current) drug therapy: Secondary | ICD-10-CM | POA: Diagnosis not present

## 2017-03-28 DIAGNOSIS — F3162 Bipolar disorder, current episode mixed, moderate: Secondary | ICD-10-CM

## 2017-03-28 DIAGNOSIS — F919 Conduct disorder, unspecified: Secondary | ICD-10-CM | POA: Diagnosis not present

## 2017-03-28 MED ORDER — TRAZODONE HCL 50 MG PO TABS
50.0000 mg | ORAL_TABLET | Freq: Every day | ORAL | Status: DC
  Administered 2017-03-28 – 2017-04-19 (×23): 50 mg via ORAL
  Filled 2017-03-28 (×23): qty 1

## 2017-03-28 MED ORDER — TOPIRAMATE 100 MG PO TABS
100.0000 mg | ORAL_TABLET | Freq: Two times a day (BID) | ORAL | Status: DC
  Administered 2017-03-28 – 2017-04-19 (×45): 100 mg via ORAL
  Filled 2017-03-28 (×45): qty 1

## 2017-03-28 MED ORDER — HYDROXYZINE HCL 25 MG PO TABS
25.0000 mg | ORAL_TABLET | Freq: Three times a day (TID) | ORAL | Status: DC
  Administered 2017-03-28 – 2017-04-19 (×67): 25 mg via ORAL
  Filled 2017-03-28 (×68): qty 1

## 2017-03-28 MED ORDER — ARIPIPRAZOLE 10 MG PO TABS
10.0000 mg | ORAL_TABLET | Freq: Every day | ORAL | Status: DC
  Administered 2017-03-29 – 2017-04-19 (×22): 10 mg via ORAL
  Filled 2017-03-28 (×22): qty 1

## 2017-03-28 NOTE — ED Notes (Signed)
Spoke with Pharmacy tech regarding meds, states pt only taking Trazodone and  Vistaril.

## 2017-03-28 NOTE — ED Notes (Signed)
Hourly rounding reveals patient sleeping in room. No complaints, stable, in no acute distress. Q15 minute rounds and monitoring via Security Cameras to continue. 

## 2017-03-28 NOTE — ED Notes (Signed)
Report to include Situation, Background, Assessment, and Recommendations received from Latrisha RN. Patient alert and oriented, warm and dry, in no acute distress. Patient denies SI, HI, AVH and pain. Patient made aware of Q15 minute rounds and security cameras for their safety. Patient instructed to come to me with needs or concerns. 

## 2017-03-28 NOTE — BH Assessment (Signed)
[facility] Assessment Progress Note  Per Dr. Jannifer Franklin and Elta Guadeloupe, NP, patient meets inpatient criteria at this time, IVC upheld. TTS to seek placement. Nathanial Millman from [facility] [phone]) came to see pt today. Pt has a mild IDD diagnosis. [facility] will fax over IQ and psychological evaluation in am. TTS unable to refer pt out without IQ and psychological information. He states that gp home may not want to take him back due to being a level 3 and not locked.

## 2017-03-28 NOTE — ED Notes (Signed)
Pt A&O x 3, no distress noted, calm & cooperative.  Sleeping at present.  Monitoring for safety, Q 15 min checks in effect. 

## 2017-03-28 NOTE — Consult Note (Signed)
[facility] Consult   Reason for Consult:  physical aggression and threatening violence Referring Physician:  EDP Patient Identification: Christopher Foley MRN:  [id number] Principal Diagnosis: Bipolar 1 disorder, mixed, moderate ([place]) Diagnosis:   Patient Active Problem List   Diagnosis Date Noted  . Conduct disorder, childhood-onset type [F91.1] 02/09/2013  . ADHD (attention deficit hyperactivity disorder), combined type [F90.2] 02/08/2013  . Bipolar 1 disorder, mixed, moderate ([place]) [F31.62] 01/15/2013    Total Time spent with patient: 45 minutes  Subjective:   Christopher Foley is a 21 y.o. male patient admitted due to aggressive behavior and violent behavior.  HPI:  Patient with history of Bipolar disorder, Conduct disorder and Intermittent explosive disorder. Patient was IVC by group home director due to being physically aggressive, violent and threatening to harm people. Patient reportedly got upset during medication administration, he states that he  broke open a capsule of his medicine and snorted it up his nose in anger. He reports hearing crazy thoughts in his head to kill the staff and threatened to kill people; group home staff, his roommates, his mother, and the police. Patient reports he was placed in group home at age 24 but was placed in [facility] care at age 83. He denies drugs and alcohol abuse.  Past Psychiatric History: as above  Risk to Self: Suicidal Ideation: No Suicidal Intent: No Is patient at risk for suicide?: No Suicidal Plan?: No Access to Means: No What has been your use of drugs/alcohol within the last 12 months?: reports to some marijuana use but does not specify amount or frequency  Triggers for Past Attempts: None known Intentional Self Injurious Behavior: None Risk to Others: Homicidal Ideation: Yes-Currently Present Thoughts of Harm to Others: Yes-Currently Present Comment - Thoughts of Harm to Others: IVC states pt made threats to group home  staff to kill them, his mother, and police officers  Current Homicidal Intent: Yes-Currently Present Current Homicidal Plan: Yes-Currently Present Describe Current Homicidal Plan: per IVC pt grabbed knives in the group home and made threats to kill staff Access to Homicidal Means: Yes Describe Access to Homicidal Means: pt was able to gain access to knives at the group home  Identified Victim: group home staff History of harm to others?: Yes Assessment of Violence: On admission Violent Behavior Description: pt has destroyed property, punched walls, became violent and aggressive with others  Does patient have access to weapons?: Yes (Comment) (knives) Criminal Charges Pending?: No (denies) Does patient have a court date: No Prior Inpatient Therapy: Prior Inpatient Therapy: Yes Prior Therapy Dates: 2014 Prior Therapy Facilty/Provider(s): [facility] Reason for Treatment: ADHD, IED Prior Outpatient Therapy: Prior Outpatient Therapy: Yes Prior Therapy Dates: current Prior Therapy Facilty/Provider(s): unknown Reason for Treatment: IED, AHD Does patient have an ACCT team?: No Does patient have Intensive In-House Services?  : No Does patient have [facility] services? : No Does patient have P4CC services?: No  Past Medical History:  Past Medical History:  Diagnosis Date  . Bipolar 1 disorder ([place])   . History of ADHD    History reviewed. No pertinent surgical history. Family History: No family history on file. Family Psychiatric  History: Social History:  History  Alcohol Use No     History  Drug Use No    Social History   Social History  . Marital status: Single    Spouse name: N/A  . Number of children: N/A  . Years of education: N/A   Social History Main Topics  . Smoking status:  Never Smoker  . Smokeless tobacco: Never Used  . Alcohol use No  . Drug use: No  . Sexual activity: Not Asked   Other Topics Concern  . None   Social History Narrative  . None   Additional  Social History:    Allergies:  No Known Allergies  Labs:  Results for orders placed or performed during the hospital encounter of 03/27/17 (from the past 48 hour(s))  Comprehensive metabolic panel     Status: Abnormal   Collection Time: 03/27/17  7:19 PM  Result Value Ref Range   Sodium 139 135 - 145 mmol/L   Potassium 4.0 3.5 - 5.1 mmol/L   Chloride 106 101 - 111 mmol/L   CO2 23 22 - 32 mmol/L   Glucose, Bld 84 65 - 99 mg/dL   BUN 12 6 - 20 mg/dL   Creatinine, Ser 0.91 0.61 - 1.24 mg/dL   Calcium 9.6 8.9 - 10.3 mg/dL   Total Protein 6.9 6.5 - 8.1 g/dL   Albumin 4.2 3.5 - 5.0 g/dL   AST 20 15 - 41 U/L   ALT 15 (L) 17 - 63 U/L   Alkaline Phosphatase 82 38 - 126 U/L   Total Bilirubin 1.0 0.3 - 1.2 mg/dL   GFR calc non Af Amer >60 >60 mL/min   GFR calc Af Amer >60 >60 mL/min    Comment: (NOTE) The eGFR has been calculated using the CKD EPI equation. This calculation has not been validated in all clinical situations. eGFR's persistently <60 mL/min signify possible Chronic Kidney Disease.    Anion gap 10 5 - 15  Ethanol     Status: None   Collection Time: 03/27/17  7:19 PM  Result Value Ref Range   Alcohol, Ethyl (B) <10 <10 mg/dL    Comment:        LOWEST DETECTABLE LIMIT FOR SERUM ALCOHOL IS 10 mg/dL FOR MEDICAL PURPOSES ONLY Please note change in reference range.   Salicylate level     Status: None   Collection Time: 03/27/17  7:19 PM  Result Value Ref Range   Salicylate Lvl <7.1 2.8 - 30.0 mg/dL  Acetaminophen level     Status: Abnormal   Collection Time: 03/27/17  7:19 PM  Result Value Ref Range   Acetaminophen (Tylenol), Serum <10 (L) 10 - 30 ug/mL    Comment:        THERAPEUTIC CONCENTRATIONS VARY SIGNIFICANTLY. A RANGE OF 10-30 ug/mL MAY BE AN EFFECTIVE CONCENTRATION FOR MANY PATIENTS. HOWEVER, SOME ARE BEST TREATED AT CONCENTRATIONS OUTSIDE THIS RANGE. ACETAMINOPHEN CONCENTRATIONS >150 ug/mL AT 4 HOURS AFTER INGESTION AND >50 ug/mL AT 12 HOURS AFTER  INGESTION ARE OFTEN ASSOCIATED WITH TOXIC REACTIONS.   cbc     Status: Abnormal   Collection Time: 03/27/17  7:19 PM  Result Value Ref Range   WBC 6.5 4.0 - 10.5 K/uL   RBC 5.96 (H) 4.22 - 5.81 MIL/uL   Hemoglobin 12.9 (L) 13.0 - 17.0 g/dL   HCT 39.2 39.0 - 52.0 %   MCV 65.8 (L) 78.0 - 100.0 fL   MCH 21.6 (L) 26.0 - 34.0 pg   MCHC 32.9 30.0 - 36.0 g/dL   RDW 16.3 (H) 11.5 - 15.5 %   Platelets 113 (L) 150 - 400 K/uL    Comment: REPEATED TO VERIFY SPECIMEN CHECKED FOR CLOTS PLATELET COUNT CONFIRMED BY SMEAR   Rapid urine drug screen (hospital performed)     Status: None   Collection Time: 03/27/17  7:19  PM  Result Value Ref Range   Opiates NONE DETECTED NONE DETECTED   Cocaine NONE DETECTED NONE DETECTED   Benzodiazepines NONE DETECTED NONE DETECTED   Amphetamines NONE DETECTED NONE DETECTED   Tetrahydrocannabinol NONE DETECTED NONE DETECTED   Barbiturates NONE DETECTED NONE DETECTED    Comment:        DRUG SCREEN FOR MEDICAL PURPOSES ONLY.  IF CONFIRMATION IS NEEDED FOR ANY PURPOSE, NOTIFY LAB WITHIN 5 DAYS.        LOWEST DETECTABLE LIMITS FOR URINE DRUG SCREEN Drug Class       Cutoff (ng/mL) Amphetamine      1000 Barbiturate      200 Benzodiazepine   101 Tricyclics       751 Opiates          300 Cocaine          300 THC              50     Current Facility-Administered Medications  Medication Dose Route Frequency Provider Last Rate Last Dose  . acetaminophen (TYLENOL) tablet 650 mg  650 mg Oral Q4H PRN Deno Etienne, DO      . alum & mag hydroxide-simeth (MAALOX/MYLANTA) 200-200-20 MG/5ML suspension 30 mL  30 mL Oral Q6H PRN Deno Etienne, DO      . [START ON 03/29/2017] ARIPiprazole (ABILIFY) tablet 10 mg  10 mg Oral Daily Janique Hoefer, MD      . hydrOXYzine (ATARAX/VISTARIL) tablet 25 mg  25 mg Oral TID Darleene Cleaver, Merari Pion, MD      . ondansetron (ZOFRAN) tablet 4 mg  4 mg Oral Q8H PRN Deno Etienne, DO      . topiramate (TOPAMAX) tablet 100 mg  100 mg Oral BID  Bonner Larue, MD      . traZODone (DESYREL) tablet 50 mg  50 mg Oral QHS Corena Pilgrim, MD       Current Outpatient Prescriptions  Medication Sig Dispense Refill  . B Complex Vitamins (VITAMIN B COMPLEX PO) Take 1 tablet by mouth daily.    . hydrOXYzine (ATARAX/VISTARIL) 25 MG tablet Take 1 tablet (25 mg total) by mouth 3 (three) times daily as needed. Take two tabs tid prn anxiety 12 tablet 0  . traZODone (DESYREL) 50 MG tablet Take 1 tablet (50 mg total) by mouth at bedtime. 30 tablet 0  . ARIPiprazole (ABILIFY) 10 MG tablet Take 1 tablet (10 mg total) by mouth every evening. (Patient not taking: Reported on 03/27/2017) 30 tablet 0  . ARIPiprazole (ABILIFY) 10 MG tablet Take 1 tablet (10 mg total) by mouth daily. At bedtime (Patient not taking: Reported on 03/27/2017) 30 tablet 1  . ARIPiprazole (ABILIFY) 10 MG tablet Take one tab PO QHS (Patient not taking: Reported on 03/27/2017) 30 tablet 0  . chlorproMAZINE (THORAZINE) 200 MG tablet Take 1 tablet (200 mg total) by mouth at bedtime. (Patient not taking: Reported on 03/27/2017) 30 tablet 0  . cloNIDine HCl (KAPVAY) 0.1 MG TB12 ER tablet Take 1 tablet (0.1 mg total) by mouth 2 (two) times daily in the am and at bedtime.. taking 2 tablets (0.2 mg total) at bedtime (Patient not taking: Reported on 03/27/2017) 90 tablet 0  . desmopressin (DDAVP) 0.2 MG tablet Take 1 tablet (0.2 mg total) by mouth at bedtime. (Patient not taking: Reported on 03/27/2017) 30 tablet 0  . hydrOXYzine (ATARAX/VISTARIL) 25 MG tablet Take 2 tablets (50 mg total) by mouth 3 (three) times daily as needed for anxiety. (Patient not  taking: Reported on 03/27/2017) 90 tablet 0  . hydrOXYzine (ATARAX/VISTARIL) 25 MG tablet Take 2 tablets (50 mg total) by mouth 3 (three) times daily. (Patient not taking: Reported on 03/27/2017) 180 tablet 0  . loratadine (CLARITIN) 10 MG tablet Take 1 tablet (10 mg total) by mouth daily. (Patient not taking: Reported on 03/27/2017) 30 tablet 0  .  OLANZapine (ZYPREXA) 10 MG tablet Take 1 tablet (10 mg total) by mouth 2 (two) times daily. At morning and evening meal (Patient not taking: Reported on 03/27/2017) 60 tablet 0  . topiramate (TOPAMAX) 25 MG tablet Take 63m by mouth in the morning and 548mby mouth in the evening. (Patient not taking: Reported on 03/27/2017) 150 tablet 0  . topiramate (TOPAMAX) 25 MG tablet Take 1 tablet (25 mg total) by mouth every morning. Take with other morning dose of Topamax 50 mg (Patient not taking: Reported on 03/27/2017) 30 tablet 1  . topiramate (TOPAMAX) 25 MG tablet Take 3 tab PO in AM, take 2 tabs PO in evening (Patient not taking: Reported on 03/27/2017) 150 tablet 0  . topiramate (TOPAMAX) 50 MG tablet Take 1 tablet (50 mg total) by mouth 2 (two) times daily. (Patient not taking: Reported on 03/27/2017) 60 tablet 1  . traZODone (DESYREL) 50 MG tablet Take 1 tablet (50 mg total) by mouth at bedtime. (Patient not taking: Reported on 03/27/2017) 30 tablet 0    Musculoskeletal: Strength & Muscle Tone: within normal limits Gait & Station: normal Patient leans: N/A  Psychiatric Specialty Exam: Physical Exam  Psychiatric: His affect is angry and labile. His speech is rapid and/or pressured. He is agitated, aggressive and actively hallucinating. Cognition and memory are normal. He expresses impulsivity. He expresses homicidal and suicidal ideation.    Review of Systems  Constitutional: Negative.   HENT: Negative.   Eyes: Negative.   Respiratory: Negative.   Cardiovascular: Negative.   Gastrointestinal: Negative.   Genitourinary: Negative.   Musculoskeletal: Negative.   Skin: Negative.   Neurological: Negative.   Endo/Heme/Allergies: Negative.     Blood pressure 117/62, pulse 60, temperature 97.9 F (36.6 C), temperature source Oral, resp. rate 17, SpO2 99 %.There is no height or weight on file to calculate BMI.  General Appearance: Casual  Eye Contact:  Good  Speech:  Clear and Coherent  Volume:   Normal  Mood:  Irritable  Affect:  Labile  Thought Process:  Coherent  Orientation:  Full (Time, Place, and Person)  Thought Content:  Logical  Suicidal Thoughts:  No  Homicidal Thoughts:  Yes.  without intent/plan  Memory:  Immediate;   Fair Recent;   Fair Remote;   Good  Judgement:  Poor  Insight:  Shallow  Psychomotor Activity:  Increased  Concentration:  Concentration: Fair and Attention Span: Fair  Recall:  [employer] Knowledge:  Good  Language:  Good  Akathisia:  No  Handed:  Right  AIMS (if indicated):     Assets:  Communication Skills Desire for Improvement Social Support  ADL's:  Intact  Cognition:  WNL  Sleep:   fair     Treatment Plan Summary: Daily contact with patient to assess and evaluate symptoms and progress in treatment and Medication management Increase Topamax to 100 mg bid for mood stabilization. Continue Abilify 10 mg daily, Hydroxyzine 25 mg TID for agitation and Trazodone 50 mg qhs for sleep  Disposition: Recommend psychiatric Inpatient admission when medically cleared.  AkCorena PilgrimMD 03/28/2017 11:35 AM

## 2017-03-28 NOTE — ED Notes (Signed)
Hourly rounding reveals patient sleeping n room. No complaints, stable, in no acute distress. Q15 minute rounds and monitoring via [employer] to continue. 

## 2017-03-29 MED ORDER — HYDROXYZINE HCL 25 MG PO TABS: 25.0000 mg | ORAL_TABLET | Freq: Three times a day (TID) | ORAL | 3 refills | Status: DC

## 2017-03-29 MED ORDER — TOPIRAMATE 100 MG PO TABS: 100.0000 mg | ORAL_TABLET | Freq: Two times a day (BID) | ORAL | 3 refills | Status: DC

## 2017-03-29 MED ORDER — ARIPIPRAZOLE 10 MG PO TABS: 10.0000 mg | ORAL_TABLET | Freq: Every day | ORAL | 3 refills | Status: DC

## 2017-03-29 MED ORDER — TRAZODONE HCL 50 MG PO TABS: 50.0000 mg | ORAL_TABLET | Freq: Every day | ORAL | 3 refills | Status: DC

## 2017-03-29 NOTE — ED Notes (Signed)
Hourly rounding reveals patient sleeping in room. No complaints, stable, in no acute distress. Q15 minute rounds and monitoring via Security Cameras to continue. 

## 2017-03-29 NOTE — BH Assessment (Addendum)
[facility] Assessment Progress Note  In order to facilitate admission to a psychiatric hospital, this writer called pertinent area providers in order to obtain psychometric testing documents for this pt.  At 09:31 I called the [facility] and spoke to [place].  She reports that pt does not have a care coordinator in their system, but does work with a Associate Professor.  Misty Stanley transferred me to [employer], and I left a message.  After finding pt's letter of guardianship, assigning guardianship to Jarome Matin in [place], [place], I called [employer] and spoke to [place].  She reports that pt's care coordinator is Jodelle Gross at [phone], whom I then called.  Vinnie Langton confirms that pt's Medicaid benefits are through [employer], and that she is pt's care coordinator.  She subsequently faxed pt's psychometric testing to me, which I have labeled and sent to the scanning center.  Per Thedore Mins, MD, this pt does not require psychiatric hospitalization at this time.  Pt presents under IVC, which Dr Jannifer Franklin has rescinded.  Pt is to be [facility] from [facility], and is to return to his group home.  Megan, LCSW, has been notified, as has pt's nurse, Edie.  Doylene Canning, MA Triage Specialist [phone]

## 2017-03-29 NOTE — [facility] Suicide Risk Assessment (Signed)
Suicide Risk Assessment  Discharge Assessment   [facility] Discharge Suicide Risk Assessment   Principal Problem: Bipolar 1 disorder, mixed, moderate ([facility]) Discharge Diagnoses:  Patient Active Problem List   Diagnosis Date Noted  . Conduct disorder, childhood-onset type [F91.1] 02/09/2013  . ADHD (attention deficit hyperactivity disorder), combined type [F90.2] 02/08/2013  . Bipolar 1 disorder, mixed, moderate ([facility]) [F31.62] 01/15/2013    Total Time spent with patient: 45 minutes  Musculoskeletal: Strength & Muscle Tone: within normal limits Gait & Station: normal Patient leans: N/A   Psychiatric Specialty Exam: Physical Exam  Psychiatric: He has a normal mood and affect. His speech is normal and behavior is normal. Thought content normal. His affect is not angry and not labile. He is not agitated, not aggressive and not actively hallucinating. Cognition and memory are normal. He expresses impulsivity. He expresses no homicidal and no suicidal ideation.   Review of Systems  Constitutional: Negative.   HENT: Negative.   Eyes: Negative.   Respiratory: Negative.   Cardiovascular: Negative.   Gastrointestinal: Negative.   Genitourinary: Negative.   Musculoskeletal: Negative.   Skin: Negative.   Neurological: Negative.   Endo/Heme/Allergies: Negative.    Blood pressure 127/73, pulse 68, temperature 98.7 F (37.1 C), temperature source Oral, resp. rate 17, SpO2 100 %.There is no height or weight on file to calculate BMI. General Appearance: Casual Eye Contact:  Good Speech:  Clear and Coherent Volume:  Normal Mood:  Euthymic Affect:  Congruent Thought Process:  Coherent Orientation:  Full (Time, Place, and Person) Thought Content:  Logical Suicidal Thoughts:  No Homicidal Thoughts:  No Memory:  Immediate;   Good Recent;   Good Remote;   Fair Judgement:  Fair Insight:  Fair Psychomotor Activity:  Normal Concentration:  Concentration: Good and Attention Span: Good Recall:   Good Fund of Knowledge:  Good Language:  Good Akathisia:  No Handed:  Right AIMS (if indicated):    Assets:  Communication Skills Desire for Improvement Financial Resources/Insurance Housing Physical Health Resilience Social Support Vocational/Educational ADL's:  Intact Cognition:  WNL   Mental Status Per Nursing Assessment::   On Admission:   Aggressive and impulsive  Demographic Factors:  Male, Adolescent or young adult and Low socioeconomic status  Loss Factors: Financial problems/change in socioeconomic status  Historical Factors: Impulsivity  Risk Reduction Factors:   Sense of responsibility to family and Living with another person, especially a relative  Continued Clinical Symptoms:  Bipolar Disorder:   Mixed State  Cognitive Features That Contribute To Risk:  Closed-mindedness    Suicide Risk:  Minimal: No identifiable suicidal ideation.  Patients presenting with no risk factors but with morbid ruminations; may be classified as minimal risk based on the severity of the depressive symptoms    Plan Of Care/Follow-up recommendations:  Activity:  as tolerated Diet:  Heart Healthy  Laveda Abbe, NP 03/29/2017, 1:11 PM

## 2017-03-29 NOTE — ED Notes (Signed)
Pt A&O x 3, no distress noted, calm & cooperative.  Monitoring for safety, Q 15 min checks in effect. 

## 2017-03-29 NOTE — Progress Notes (Addendum)
03/29/17 1410:  LRT introduced self to pt and offered activities.  Pt wanted to play [employer].  LRT and pt played several games of [employer].  Pt was pleasant, smiling and spoke a little about sports and what his favorite teams were.  Caroll Rancher, LRT/CTRS
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
## 2017-03-29 NOTE — ED Notes (Signed)
Pt has been calm and cooperative all day.  He denies S/I, H/I, and AVH.  He is compliant with all meds.  15 minute checks and video monitoring in place.
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
## 2017-03-30 NOTE — Progress Notes (Addendum)
UPDATE: CSW received call back from Avanell Shackleton, patients Care Coordinator with [facility], who stated he is not aware of any policy that states patient is unable to return to group home if patient has been hospitalized within his 60-day notice period. Per Avanell Shackleton, [facility] is actively attempting to contact group home owner to determine if patient can return however contact has not been established. At this time, CSW is waiting to hear from Avanell Shackleton regarding immediate discharge plans.   CSW spoke with social work Investment banker, operational regarding group home stating they are unable to accept patient back at this time. Chiropodist has reached out to [employer] and [employer] both with [employer]; [employer]; all parties did not answer and voicemails were left for return call.   Per notes, patient has received 60-day notice of discharge from facility copy is located on patients chart.   Per notes, patient is unable to return to group home due to being hospitalized within his 60-day notice and patient receiving IPRS services however patient has Medicaid and receives services through [employer]. At this time, CSW does not think patient receives IPRS but will need to verify with [facility]. If patient does NOT receive IPRS- group home is responsible for picking patient up immediately.   CSW continues to attempt to contact group home at this time to determine if patient is able to return. CSW will continue to update for disposition.   Stacy Gardner, [facility] Emergency Room Clinical Social Worker [phone]

## 2017-03-30 NOTE — ED Provider Notes (Signed)
11:34 AM Care coordination came to report that patient has been medically cleared by the previous emergency team and is now psychiatrically cleared by the psychiatry team. They report that the patient's previous group home has refused to take them. As patient is not a safe place to go home to, care coordination and social work is actively working to try and figure out his disposition. Patient is concerned about the amount of school he is missing. Patient was assured that he will try to be given a school note when he is ultimately discharged. As patient does not have a sickly for discharge, will continue to try and find the safe disposition plan for him.    On my exam, patient was tearful but had no other complaints. His lungs were clear and chest is nontender on my exam. Patient had normal gait and had no other significant abnormalities on my exam.   Anticipate following up on the care coordination recommendations for disposition.        Tegeler, Canary Brim, MD 03/30/17 [phone]

## 2017-03-30 NOTE — ED Notes (Signed)
Bed: WA27 Expected date: 03/30/17 Expected time: 1:28 PM Means of arrival:  Comments: Cleatis Polka
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
## 2017-03-31 NOTE — Progress Notes (Addendum)
Nighttime CSW received a call from [facility] of [facility] at  Ph:[phone] stating:  1. Group home has been telling some reps from [facility] that they would accept the pt back and then telling others at [facility] they would not.  2. Gaetano Hawthorne states that Anselmo, her supervisee, was informed by a Product manager assigned to assist the pt and by investigating himself that per notes, pt has a [facility] which could signify that pt is a Armed forces operational officer LME client and NOT a [facility] LME Client as was previously thought.  3.  Gaetano Hawthorne of [facility] is calling now to verify whether pt is [place] or not and will call the CSW back to verify.  10:19 PM   1. Nighttime CSW received a call from [facility] of [facility] at  [phone] stating:  1. Miss Caffie Damme will have an answer on 10/4 on whether pt's state-funded housing is received by [facility] or [place] and:  2. [facility]'s enforcement division referred to as "[employer]" has been or will be notified GH is refusing re-admission and that "[employer]" will be contacting the [facility] to tell them the group home HAS TO TAKE THE PT back".  3.  Miss [place], depending on the [facility] funding question will contact the [facility] ED CSW and/or CSW Asst Director on 10/30 to update [facility] ED on next step.  Dorothe Pea. Keeyon Privitera, LCSW, LCAS, CSI Clinical Social Worker Ph: [phone]
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
## 2017-04-01 NOTE — Progress Notes (Addendum)
CSW received a call from Everlean Alstrom with [facility] who called to speak to daytime ED CSW and relayed information that Everlean Alstrom has a provider that is willing to take the pt Alona Bene from [facility]), but that the provider, Alona Bene with [facility] is out of the state, and will not be back for 10 days.  Everlean Alstrom reported he called Haze Boyden with [place] [facility] and talked about getting the pt into a resource center stay, for either a planned stay (3 days to 2 weeks), or a crisis stay (up to 30 days).    Everlean Alstrom reported he was hoping pt can go to resource center stay at [facility], until [place] returns, but if there is no crisis stay available, Everlean Alstrom hopes to call [facility] and attempt to get pt placed at [facility] temporarily, until [employer] or [place] from returns.    Everlean Alstrom will contact [facility] and attempt to place pt there tonight or tomorrow until other options are open.  Nighttime ED CSW stated he could take this information and relay it to daytime ED CSW but that Everlean Alstrom would have to call and update daytime ED CSW as well by phone as daytime ED CSW is taking the lead on pt's case.  Everlean Alstrom agreed to call daytime ED CSW on the morning of 10/5.   4:20 PM CSW received a call from Dicie Beam at [phone] who stated "Pt's mother doesn't want him at home" and that [facility] will not take the pt back and that the [facility] states they are reviewing whether other group homes in their organization are appropriate.    Miss Katrinka Blazing stated Shelly Coss is trying to determine if pt's special funding for housing will come from [place] or [place].  Delaney Meigs also reported her and her directors are emailing out to providers in their catchment (two parent homes with no small children) for a crisis bed to assist with D/C 'ing the pt from the ED ASAP.  Delaney Meigs reported that her and "Shelly Coss was working extremely hard to find placement" at this time.  Delaney Meigs also reported that SHE HAD ALREADY CALLED [place]  [facility] AND [facility] AND THAT NEITHER COULD ACCEPT THE PT AT THIS TIME.  CSW stated he would relay this information to daytime ED CSW who will be on duty on 10/5 and Delaney Meigs stated she was scheduled to speak to the [facility] daytime ED CSW on a conference call in the morning.  Dorothe Pea. Iveth Heidemann, LCSW, LCAS, CSI Clinical Social Worker Ph: [phone]

## 2017-04-01 NOTE — Progress Notes (Signed)
2:15PM: Conference call was completed with:Tamara Smith from [place], Jodelle Gross from [place],  and [employer] of [employer]. CSW updated individuals of most recent conversation from group home rep, Everlean Alstrom, and potential plans for [facility] to have new AFL for patient to discharge to. At this time, all parties seem to be working towards patient discharging back with [facility] in hopes they are able to place patient in the AFL "[facility]". Barriers to this plan include: [facility] contracting with facility and no specific time frame. If [facility] can provide patient with an acceptance date- a crisis bed can then be located through [facility].   1PM: CSW received call from Everlean Alstrom with [facility] stating himself and owner were looking into patient being discharged to another one of their facilities at this time called "[facility]". Per Everlean Alstrom, it has yet to be determined if Shelly Coss is able to contract with this facility. However, if Shelly Coss is able to contract with [facility] and a date is established for AFL to take patient [place] [facility] will determine if a crisis bed is needed.   11AM: CSW received call from patients new [facility], Dicie Beam [phone], requesting additional and discharging planning. CSW informed Care Coordinator of patients actions that brought him into the ED and events/ conversations leading to this point in discharging planning. CSW informed Care Coordinator that patient is currently located in ED and in need of immediate discharge plan at this time. Dicie Beam requested contact information for Everlean Alstrom with [facility]; Jodelle Gross with [facility]; and Gaetano Hawthorne with [facility] to determine what next steps needed to be completed for patient to be discharged from the ED.   9:15AM: Gaetano Hawthorne contacted CSW and stated she spoke with Everlean Alstrom from [facility] and at  this time patient is not able to return to facility. CSW requested clarification regarding whether patient currently has Horticulturist, commercial or [place]. Per Gaetano Hawthorne, as of yesterday 10/3 [facility] found out patient has [facility]- which is in [place] catchment area. Per Gaetano Hawthorne, both [place] and [place] will be working on placing patient due to him currently being "displaced". At this time CSW is unsure who patient will be receiving financial services from due to [facility] no longer serving patient and [facility] needing extensive information before providing financial services.   8AM: CSW received call from Everlean Alstrom with [facility] this morning 10/4 stating group home was able to pick patient up however they were waiting for call back from [facility] to receive the "go ahead". CSW spoke with Gaetano Hawthorne, Civil Service fast streamer for [facility], to confirm this information- who stated this was false and [facility] had not been returning [facility] call at this time. Gaetano Hawthorne stated she would give CSW call with any additional information IF patient could return to [facility]/ other group home within [facility].   Stacy Gardner, [facility] Emergency Room Clinical Social Worker [phone]

## 2017-04-02 NOTE — ED Provider Notes (Signed)
Patient alert and oriented 4. He has no complaints of at this time. Discussed with social work and they're attempting to place.   Lorre Nick, MD 04/02/17 [phone]

## 2017-04-02 NOTE — Progress Notes (Addendum)
3:15PM: CSW received call from Dicie Beam stating patient may have crisis bed at [facility]. Delaney Meigs stated she was unsure if [facility] could take patient this evening or this weekend but facility DID have crisis beds available and were actively looking over patients information to determine if they could accept him. CSW informed Delaney Meigs of social work number to contact for this evening 10/5 if crisis bed is found and weekend number.     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 CSW's previous notes state potential acceptance to AFL however this is no longer an option due to patient not receiving IDD services from [place]. Patient will only be receiving mental health services. Patient is not able to discharge to [facility] crisis bed due to no longer have acceptance to AFL. At this time, Shelly Coss is reaching out to mental health group homes and any crisis beds in the area that will agree to accept patient. CSW has updated Wellsite geologist who has agreed to contact [facility] to help expedite discharge.   12:15PM: Conference call completed with: Dicie Beam from [place] from [place], [place] Engineer, structural from [place], Jodelle Gross from [place], [place] from [place], Avanell Shackleton from [place],  [place] from [place], Becky Sax from [facility].  At this time, plan is still to pursue [facility] located at [address] [place]. Due to financial concerns with Shelly Coss, tentative acceptance date for October 15th may be pushed back.  Per Pandora Leiter with [place] [facility], only 1 crisis bed is currently available and they are unsure if it will still be available once confirmed plan is in place. Dicie Beam with [facility]ly Coss will contact CSW at [facility] to give update regarding potential contract  with [facility].    10:30AM: CSW spoke with Everlean Alstrom from [facility]- at this time plan in still to pursue placement at [facility]. Manager of this AFL, Alona Bene, will not be returning to [facility] until October 15th which has been set as tentattve acceptance date for patient. Per Everlean Alstrom, [place] [facility] is aware of tentative acceptance date and is in search for crisis bed. CSW has conference call this AM at 11:30 and hopes to have additional information.   [facility] is NOT an option for patient due to patient being older than 17. [facility] only accepts patients ages 27-17.   Stacy Gardner, [facility] Emergency Room Clinical Social Worker [phone]

## 2017-04-02 NOTE — ED Notes (Signed)
FU with Dicie Beam at [phone] regarding pt's placement status and to see if [facility] crisis bed was going to be available for pt this evening. No answer, VM left with contact information.  CSW will continue to follow for DC planning.  Zeena Starkel B. Gean Quint Clinical Social Work Dept Weekend Social Worker [phone] 8:04 PM

## 2017-04-02 NOTE — ED Notes (Signed)
Update from social work=  Still awaiting placement

## 2017-04-02 NOTE — ED Notes (Signed)
Social work case; awaiting placement

## 2017-04-03 NOTE — ED Notes (Signed)
Pt a/o x 4 and is lying in bed watching a football game.  Per NT staff, pt has been cooperative and calm all day.  Pt denies any pain, SI/HI/AVH. NAD noted. Will continue to monitor.

## 2017-04-03 NOTE — Progress Notes (Signed)
CSW received phone call from Care Coordinator Tamera regarding possible crisis bed available at [facility]. CSW contacted LaVonda at [facility] to discuss case. Per Wilhemina Cash, she will need updated assessment, med list, lab work, psych eval stating patient is psychiatrically cleared and some updated clinical notes for review. CSW to fax information to [phone] for review.   Fernande Boyden, LCSWA Clinical Social Worker [facility] Emergency Room Ph: [phone]

## 2017-04-04 NOTE — ED Provider Notes (Signed)
Vitals:   04/03/17 2211 04/04/17 0635  BP: 138/73 133/89  Pulse: (!) 50 (!) 55  Resp: 18 18  Temp: 97.9 F (36.6 C) 97.6 F (36.4 C)  SpO2: 100% 100%   Pt appears stable.  Pleasant and cooperative.  Awaiting placement.   Linwood Dibbles, MD 04/04/17 [phone]

## 2017-04-05 NOTE — ED Notes (Signed)
Pt stated "ain't nobody told me nothing.  I've seen a lot of social workers but they ain't told me nothing."  This RN attempted to call Christiane Ha, CSW, received recording to LM.

## 2017-04-05 NOTE — Progress Notes (Signed)
2:00PM: CSW received return call from Dicie Beam stating at this time [facility] still does not have any beds available for patient. Ms. Katrinka Blazing has completed application for patient for therapeutic foster care. Ms. Katrinka Blazing will continue to follow up with Crisis Center's, patients potential for therapeutic foster care, and long-term placement. CSW will Arts administrator and continue to follow up for discharge plans.   CSW spoke with Dicie Beam, patients Care Coordinator with Shelly Coss, she reached out to [facility] in [place]- at this time they only have 1 male bed available but instructed Ms. Smith to reach back out around 1PM today. At this time, Ms. Katrinka Blazing is reached out to other Crisis Centers in the area and continue to seek long term placement at group home.   CSW will follow up with Dicie Beam around 1PM today 10/8 for most recent discharge plan.   Stacy Gardner, [facility] Emergency Room Clinical Social Worker [phone]

## 2017-04-06 NOTE — NC FL2 (Signed)
[place] MEDICAID FL2 LEVEL OF CARE SCREENING TOOL     IDENTIFICATION  Patient Name: Christopher Foley Birthdate: [birth year] Sex: male Admission Date (Current Location): 03/27/2017  [facility] and [place] Number:  Producer, television/film/video and Address:  [facility],  [address], [place] [address]      Provider Number: [id number]  Attending Physician Name and Address:  Default, Provider, MD  Relative Name and Phone Number:       Current Level of Care: Hospital Recommended Level of Care: Family Care Home Prior Approval Number:    Date Approved/Denied:   PASRR Number:    Discharge Plan: Other (Comment) (Family Care Home)    Current Diagnoses: Patient Active Problem List   Diagnosis Date Noted  . Conduct disorder, childhood-onset type 02/09/2013  . ADHD (attention deficit hyperactivity disorder), combined type 02/08/2013  . Bipolar 1 disorder, mixed, moderate (HCC) 01/15/2013    Orientation RESPIRATION BLADDER Height & Weight     Self, Time, Situation, Place  Normal Continent Weight: 226 lb 9 oz (102.8 kg) Height:     BEHAVIORAL SYMPTOMS/MOOD NEUROLOGICAL BOWEL NUTRITION STATUS      Continent Diet (Regular )  AMBULATORY STATUS COMMUNICATION OF NEEDS Skin   Independent Verbally Normal                       Personal Care Assistance Level of Assistance  Bathing, Feeding, Dressing Bathing Assistance: Independent Feeding assistance: Independent Dressing Assistance: Independent     Functional Limitations Info             SPECIAL CARE FACTORS FREQUENCY                       Contractures      Additional Factors Info  Code Status, Allergies Code Status Info: Full  Allergies Info: NKA            Current Medications (04/06/2017):  This is the current hospital active medication list Current Facility-Administered Medications  Medication Dose Route Frequency Provider Last Rate Last Dose  . acetaminophen (TYLENOL) tablet 650 mg  650 mg  Oral Q4H PRN Melene Plan, DO      . alum & mag hydroxide-simeth (MAALOX/MYLANTA) 200-200-20 MG/5ML suspension 30 mL  30 mL Oral Q6H PRN Melene Plan, DO      . ARIPiprazole (ABILIFY) tablet 10 mg  10 mg Oral Daily Akintayo, Mojeed, MD   10 mg at 04/06/17 0811  . hydrOXYzine (ATARAX/VISTARIL) tablet 25 mg  25 mg Oral TID Thedore Mins, MD   25 mg at 04/06/17 0811  . ondansetron (ZOFRAN) tablet 4 mg  4 mg Oral Q8H PRN Melene Plan, DO      . topiramate (TOPAMAX) tablet 100 mg  100 mg Oral BID Thedore Mins, MD   100 mg at 04/06/17 0811  . traZODone (DESYREL) tablet 50 mg  50 mg Oral QHS Akintayo, Mojeed, MD   50 mg at 04/05/17 2300   Current Outpatient Prescriptions  Medication Sig Dispense Refill  . B Complex Vitamins (VITAMIN B COMPLEX PO) Take 1 tablet by mouth daily.    . hydrOXYzine (ATARAX/VISTARIL) 25 MG tablet Take 1 tablet (25 mg total) by mouth 3 (three) times daily as needed. Take two tabs tid prn anxiety 12 tablet 0  . traZODone (DESYREL) 50 MG tablet Take 1 tablet (50 mg total) by mouth at bedtime. 30 tablet 0  . ARIPiprazole (ABILIFY) 10 MG tablet Take 1 tablet (10 mg  total) by mouth every evening. (Patient not taking: Reported on 03/27/2017) 30 tablet 0  . ARIPiprazole (ABILIFY) 10 MG tablet Take 1 tablet (10 mg total) by mouth daily. At bedtime (Patient not taking: Reported on 03/27/2017) 30 tablet 1  . ARIPiprazole (ABILIFY) 10 MG tablet Take one tab PO QHS (Patient not taking: Reported on 03/27/2017) 30 tablet 0  . ARIPiprazole (ABILIFY) 10 MG tablet Take 1 tablet (10 mg total) by mouth daily. 30 tablet 3  . chlorproMAZINE (THORAZINE) 200 MG tablet Take 1 tablet (200 mg total) by mouth at bedtime. (Patient not taking: Reported on 03/27/2017) 30 tablet 0  . cloNIDine HCl (KAPVAY) 0.1 MG TB12 ER tablet Take 1 tablet (0.1 mg total) by mouth 2 (two) times daily in the am and at bedtime.. taking 2 tablets (0.2 mg total) at bedtime (Patient not taking: Reported on 03/27/2017) 90 tablet 0  .  desmopressin (DDAVP) 0.2 MG tablet Take 1 tablet (0.2 mg total) by mouth at bedtime. (Patient not taking: Reported on 03/27/2017) 30 tablet 0  . hydrOXYzine (ATARAX/VISTARIL) 25 MG tablet Take 2 tablets (50 mg total) by mouth 3 (three) times daily as needed for anxiety. (Patient not taking: Reported on 03/27/2017) 90 tablet 0  . hydrOXYzine (ATARAX/VISTARIL) 25 MG tablet Take 2 tablets (50 mg total) by mouth 3 (three) times daily. (Patient not taking: Reported on 03/27/2017) 180 tablet 0  . hydrOXYzine (ATARAX/VISTARIL) 25 MG tablet Take 1 tablet (25 mg total) by mouth 3 (three) times daily. 90 tablet 3  . loratadine (CLARITIN) 10 MG tablet Take 1 tablet (10 mg total) by mouth daily. (Patient not taking: Reported on 03/27/2017) 30 tablet 0  . OLANZapine (ZYPREXA) 10 MG tablet Take 1 tablet (10 mg total) by mouth 2 (two) times daily. At morning and evening meal (Patient not taking: Reported on 03/27/2017) 60 tablet 0  . topiramate (TOPAMAX) 100 MG tablet Take 1 tablet (100 mg total) by mouth 2 (two) times daily. 60 tablet 3  . topiramate (TOPAMAX) 25 MG tablet Take  by mouth in the morning and  by mouth in the evening. (Patient not taking: Reported on 03/27/2017) 150 tablet 0  . topiramate (TOPAMAX) 25 MG tablet Take 1 tablet (25 mg total) by mouth every morning. Take with other morning dose of Topamax 50 mg (Patient not taking: Reported on 03/27/2017) 30 tablet 1  . topiramate (TOPAMAX) 25 MG tablet Take 3 tab PO in AM, take 2 tabs PO in evening (Patient not taking: Reported on 03/27/2017) 150 tablet 0  . topiramate (TOPAMAX) 50 MG tablet Take 1 tablet (50 mg total) by mouth 2 (two) times daily. (Patient not taking: Reported on 03/27/2017) 60 tablet 1  . traZODone (DESYREL) 50 MG tablet Take 1 tablet (50 mg total) by mouth at bedtime. (Patient not taking: Reported on 03/27/2017) 30 tablet 0  . traZODone (DESYREL) 50 MG tablet Take 1 tablet (50 mg total) by mouth at bedtime. 30 tablet 3     Discharge  Medications: Please see discharge summary for a list of discharge medications.  Relevant Imaging Results:  Relevant Lab Results:   Additional Information    Donnie Coffin, LCSW

## 2017-04-06 NOTE — Progress Notes (Signed)
CSW received call from pt's [facility] requesting an update on plan of care for pt at this time. CSW informed Air traffic controller that this particular CSW is limited with the amount of information that she can provide due to pt being at another hospital. CSW provided Air traffic controller with [facility] ED CSW information Stacy Gardner) to follow up with further information on pt's plan of care at this time. This CSW signing off.    Christopher Foley Christopher Foley, MSW, LCSW-A Emergency Department Clinical Social Worker [phone]

## 2017-04-06 NOTE — Progress Notes (Signed)
CSW spoke with Esther Hardy, Care Coordinator with [facility], this AM regarding disposition planning. [name] is continuously seeking placement with a crisis bed for immediate placement. [name] is also seeking placement with a therapeutic foster family and long-term placement with a level 3 mental health group home.   CSW met with social work Microbiologist this AM to provided up. CSW will continue to update for disposition plans.   Kingsley Spittle, [facility] Emergency Room Clinical Social Worker [phone]

## 2017-04-06 NOTE — ED Notes (Addendum)
Patients IVC paperwork has expired. No sitter documentation from this writer this shift. Nurse made aware.

## 2017-04-06 NOTE — ED Provider Notes (Signed)
Patient seen. Sitting on edge of his bed. He is smiling and watching TV. States he understands that he is still waiting for appropriate facility for discharge. He has no complaints. No abnormal vital signs. Medically quite stable.   Rolland Porter, MD 04/06/17 [phone]

## 2017-04-08 NOTE — Progress Notes (Addendum)
2:30PM: CSW received call from Care Coordinator stating Shelly Coss had located a potential group home for patient. Barriers to discharge at this time include: funding from [facility] [employer] for group home, single case agreement specifically for this group home, and legal guardianship (mother) involvement. Will continue to update.   10/11 11:30AM: CSW reached out to multiple group homes in [place] and [place] to determine possible bed availabilities:  [facility] ([facility] supervised living): has beds available please contact Quintin Pullin for referrals at [phone] [facility] (Level 3 residential treatment): has beds available please contact Birder Robson at [phone] [facility] ([place] Supervised Living): has beds available please contact Jan at [phone] [facility] ([place] Supervised Living): contact number is [phone], fax number is [phone] for referrals [facility] (Level 3 residential treatment): contact Ventura Bruns [phone] for referrals and requested to be in contact with Care Coordinator.   CSW will continue to follow up with these group homes for immediates placement and inform Care Coordinator of additional group homes that can be contacted. CSW will also update social work Investment banker, operational.    LATE POST 10/10: CSW received phone call from patients care coordinator stating [facility] had bed for patient this past Saturday but were informed that patient was still under IVC- therefore could not extend bed offer at that time. Care coordinator continue to seek crisis bed and group home placement.   CSW will question if [facility] in [place] and any other locations is still an option at this time. CSW will also contact [facility] and [facility] to determine if beds are available at this time.   Stacy Gardner, [facility] Emergency Room Clinical  Social Worker [phone]

## 2017-04-09 NOTE — ED Notes (Signed)
Patient denies pain and is resting comfortably.  

## 2017-04-11 NOTE — ED Provider Notes (Signed)
Patient standing in his room appears calm your movie on his iPhone. Denies complaint   Doug Sou, MD 04/11/17 [phone]

## 2017-04-12 NOTE — Progress Notes (Signed)
3:30: CSW completed APS report with Christopher Foley due to concerns with legal guardian completing needed information for patients disability.    1:30PM: CSW spJohny Shockcial work Wellsite geologist, Dr. Jacky Kindle, and social work Interior and spatial designer, [employer], regarding complications with patients current funding from [facility] [employer]. Social work Interior and spatial designer has reached out to [employer] to determine patients funding resources and disposition. Shelly Coss continues to seek a crisis bed as an immediate discharge plan and therapeutic foster care. Social work Interior and spatial designer will reach back out to [facility] regarding concerns with patients disability.   11:00AM: CSW spoke with patients legal guardian/mother Christopher Foley at [phone] regarding concerns with patients disability status. Per mother, she was asked for additional information for patients disability on June 27th of this year which mother did not have. Patients mother has continued to follow up however has yet to provided needed information for patients disability.    Stacy Gardner, [facility] Emergency Room Clinical Social Worker [phone]

## 2017-04-13 NOTE — Progress Notes (Addendum)
LATE POST 10/16: CSW spoke with Jennette Kettle with [facility] DSS- APS report was screened out at this time.   [facility] Care Coordination continues to seek placement for patient with a therapeutic foster family and placement into group living high.   Barriers to discharge: patients disability status/funding, family involvement, bed availability at this time.    CSW will continue to update for discharge planning.   Stacy Gardner, [facility] Emergency Room Clinical Social Worker [phone]

## 2017-04-13 NOTE — ED Provider Notes (Signed)
Vitals:   04/13/17 0024 04/13/17 0653  BP: (!) 140/42 (!) 112/51  Pulse: (!) 50 (!) 59  Resp: 18 18  Temp: 97.9 F (36.6 C) 97.9 F (36.6 C)  SpO2: 100% 100%   Pt is laying in bed playing on his phone.  No apparent distress, no issues overnight.   Rolan Bucco, MD 04/13/17 [phone]

## 2017-04-14 NOTE — Progress Notes (Addendum)
CSW received called from [facility] Coordinator- patient has been [facility] [place] [facility] [place]. [facility] is requesting a client specific agreement for patient to be placed into facility. Once client specific agreement can be determined between facility and [facility]- an acceptance date can be set.  Barriers to discharge at this time: client specific agreement between group home/ [facility] and legal guardian involvement.   Christopher GardnerErin Pearley Foley, [facility] Emergency Room Clinical Social Worker [phone]
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
## 2017-04-14 NOTE — ED Notes (Signed)
Pt stated "I've been in a group home since I was 21 y/o.  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  [facility] waiting on them to find me a group home."

## 2017-04-15 NOTE — Progress Notes (Signed)
2nd shift ED CSW received a call from [name], Care Coordinator with [facility], this PM regarding disposition planning.  Miss Katrinka BlazingSmith asked if this writer was aware if the pt's intended group home had scheduled [facility].  2nd shift ED CSW stated CSW was unsure but that Miss Katrinka BlazingSmith could speak to the 1st shift ED CSW on the following day.  2nd shift ED CSW will leave handoff for 1st shift ED CSW.  Please reconsult if future social work needs arise.  CSW signing off, as social work intervention is no longer needed.  Dorothe PeaJonathan F. Willy Vorce, LCSW, LCAS, CSI Clinical Social Worker Ph: [phone]

## 2017-04-15 NOTE — ED Provider Notes (Signed)
Patient sitting in bed coloring with crayons. Alert appears in no distress.Denies any complaint   Christopher Foley, Anna Beaird, MD 04/15/17 1434

## 2017-04-16 NOTE — Progress Notes (Addendum)
3:15PM: CSW spoke with Aurea GraffLynn Beattie from [facility] who stated patient does NOT need Fl2/ PASRR for discharge to therapeutic foster care. [facility] is unsure of location of patients belongings and requests CSW to reach out to previous group home for assistance with providing patient belongings. If patient is discharged over the weekend he will need hard copy of all current medication.   9:15AM: CSW received call [name], [facility], patient has a confirmed crisis bed through [facility] with a therapeutic foster family. Care coordinator is unsure at this time when patient will be able to be discharged to crisis bed however anticipates it being either today 10/19 or this weekend. Ms. Katrinka BlazingSmith stated she would contact CSW with a more solidified time frame.    CSW attempted to contact Anselmo RodJerome White, Production designer, theatre/television/filmmanager of [facility] at [phone], to schedule a meeting time with patient. CSW called X2 with no answer- voicemail left for return call.   Stacy GardnerErin Amiel Sharrow, [facility] Emergency Room Clinical Social Worker [phone]

## 2017-04-16 NOTE — Progress Notes (Signed)
CSW updated pt's RN as to new plan for pt to be D/C'd on Monday 10/22.  CSW spoke to pt and apologized for the delay in D/C and provided active listening and offered to validate pt's feelings.  Pt stated that he understood and was appreciative and confirmed he understood that the tentative plan was for pt to D/C on Monday.    CSW encouraged pt to ask for the CSW should the pt need anything or if pt would like to talk.  Pt was appreciative and thanked the CSW.  Please reconsult if future social work needs arise.  CSW signing off, as social work intervention is no longer needed.  CSW will leave handoff for ED CSW who is [facility] on 04/18/17.  Dorothe PeaJonathan F. Sameena Artus, LCSW, LCAS, CSI Clinical Social Worker Ph: [phone]

## 2017-04-16 NOTE — Progress Notes (Addendum)
2nd shift ED CSW received a call from Christopher ChuteLynn Foley and Christopher Foley called and informed this Clinical research associatewriter that the plan was for [facility], specifically a therapeutic foster parent to pick up the pt, but the therapeutic foster parent man who is supposed to pick up the pt and allow the pt to reside with him hasn't been able to be reached by phone.  Miss [place] [facility] from [facility] stated that she cannot find out from [facility] when the therapeutic foster parent will be able to pick the pt up and that it might be asl late as Tuesday.  Miss Christopher Foley stated a back-up home may be found until the original therapeutic foster parent is found, but that [facility] and [facility] are worried about "how this might affect the pt therapeutically".  CSW called CSW Director who asked that Christopher Foley call her.  Christopher Foley stated she will call CSW Director now.  4:52 PM CSW received a call from Christopher Foley from [facility] asking for the 1st shift CSW.  2nd shift ED CSW states 1st shift CSW has left but that CSW Director is taking the lead at this time and is interested in updates.  CSW provided Christopher Foley from [facility] with the CSW Directors phone number and asked Christopher Foley from [facility] to please call the 2nd shift ED CSW back if he is unable to reach the CSW Director.  4:55 PM CSW Director called and stated that the Director has spoken to UGI CorporationLynn Foley at [facility] and [facility] from [facility]  and that at this time the plan is for the pt to D/C to the original interim therapeutic foster parent designee Christopher Foley on Monday 04/16/17 until final arrangements can be made to seek and acquire the [facility]' for the pt.  CSW is to verbally explain to the staff that at this time it and pt that pt's Christopher Foley the original  therapeutic foster parent confirmed on Monday  [facility].  [facility] on-call number is possessed by the CSW Director should it be needed.  [facility]:ED Staff is encouraged  to call the CSW Director Christopher Foley at ph: [phone] if there are any questions or if any staff in the [facility] ED need clarification on pt's disposition.  CSW Director has asked that the pt, for the pt's best therapeutic outcome, is to only be told that the plan is for the pt to be D/C'd on Monday 10/22 to the therapeutic foster parent who is best equipped to assist him for now.  CSW Director asked that this be explained to the ED TCU staff and to the pt  Please reconsult if future social work needs arise.  CSW signing off, as social work intervention is no longer needed.  Christopher PeaJonathan F. Klani Caridi, LCSW, LCAS, CSI Clinical Social Worker Ph: [phone]

## 2017-04-17 MED ORDER — LORAZEPAM 1 MG PO TABS: 1.0000 mg | ORAL_TABLET | Freq: Once | ORAL | Status: DC

## 2017-04-17 NOTE — ED Notes (Signed)
ASSUMED CARE OF PT. AAOX4. PT IN NO APPARENT DISTRESS OR PAIN. WILL CONTINUE TO MONITOR.

## 2017-04-18 NOTE — ED Notes (Signed)
Bed: WA27 Expected date:  Expected time:  Means of arrival:  Comments: 

## 2017-04-18 NOTE — ED Notes (Signed)
1 bag of belongings in locker 27

## 2017-04-18 NOTE — Progress Notes (Signed)
Patient is able to have cell phone for behavior modification- this was discussed with Jerilee HohStacy West, RN when patient was psychiatrically cleared. Per patient, video games calms him down- which is all he is allowed to use on his cell phone. Patient is not allowed to make phone calls or text messages- patient has been informed of regulations with cell phone. Patient understands this privilege is contingent on good behavior should it need to be taken away.    CSW has updated charge Consulting civil engineerN and ED director.   Stacy GardnerErin Tykisha Areola, [facility] Emergency Room Clinical Social Worker [phone]

## 2017-04-19 MED ORDER — HYDROXYZINE HCL 25 MG PO TABS: 25.0000 mg | ORAL_TABLET | Freq: Three times a day (TID) | ORAL | 0 refills | Status: DC

## 2017-04-19 MED ORDER — ARIPIPRAZOLE 10 MG PO TABS: 10.0000 mg | ORAL_TABLET | Freq: Every day | ORAL | 0 refills | Status: DC

## 2017-04-19 MED ORDER — TRAZODONE HCL 50 MG PO TABS: 50.0000 mg | ORAL_TABLET | Freq: Every day | ORAL | 0 refills | Status: DC

## 2017-04-19 MED ORDER — TOPIRAMATE 100 MG PO TABS: 100.0000 mg | ORAL_TABLET | Freq: Two times a day (BID) | ORAL | 0 refills | Status: DC

## 2017-04-19 NOTE — Progress Notes (Signed)
1:15PM:  CSW received call from Christopher Foley with [facility], [phone], confirming that patients therapeutic foster parent Christopher Foley will pick patient up today 10/22. [facility] between 6:30pm-7pm. Patient has hard copies of all current medication's on chart. Patient has limited clothing with him- CSW has spoken with Christopher Foley at [phone] (manager at previous group home) and he will coordinate with new foster parent to provide belongings.   PATIENT WILL NEED ANY NIGHT TIME MEDS GIVEN BEFORE DISCHARGE.   CSW attempted to contact Christopher Foley X2 at [phone] ex. [phone] with no answer- CSW left voicemail for return call.  CSW spoke with Christopher Foley, patients care coordinator, who will notify CSW with any update on patient discharge today.   Christopher GardnerErin Malakai Foley, [facility] Emergency Room Clinical Social Worker [phone]

## 2017-07-03 DIAGNOSIS — K625 Hemorrhage of anus and rectum: Secondary | ICD-10-CM | POA: Diagnosis present

## 2017-07-03 DIAGNOSIS — K649 Unspecified hemorrhoids: Secondary | ICD-10-CM | POA: Diagnosis not present

## 2017-07-03 DIAGNOSIS — F902 Attention-deficit hyperactivity disorder, combined type: Secondary | ICD-10-CM | POA: Insufficient documentation

## 2017-07-03 DIAGNOSIS — Z79899 Other long term (current) drug therapy: Secondary | ICD-10-CM | POA: Insufficient documentation

## 2017-07-03 DIAGNOSIS — K5900 Constipation, unspecified: Secondary | ICD-10-CM | POA: Diagnosis not present

## 2017-07-03 DIAGNOSIS — F319 Bipolar disorder, unspecified: Secondary | ICD-10-CM | POA: Diagnosis not present

## 2017-07-04 ENCOUNTER — Other Ambulatory Visit: Payer: Self-pay

## 2017-07-04 ENCOUNTER — Encounter (HOSPITAL_COMMUNITY): Payer: Self-pay | Admitting: Emergency Medicine

## 2017-07-04 ENCOUNTER — Emergency Department (HOSPITAL_COMMUNITY)
Admission: EM | Admit: 2017-07-04 | Discharge: 2017-07-04 | Disposition: A | Discharge status: Home / Self Care | Payer: Medicaid Other | Attending: Emergency Medicine | Admitting: Emergency Medicine

## 2017-07-04 DIAGNOSIS — K649 Unspecified hemorrhoids: Secondary | ICD-10-CM

## 2017-07-04 DIAGNOSIS — F319 Bipolar disorder, unspecified: Secondary | ICD-10-CM

## 2017-07-04 DIAGNOSIS — K5900 Constipation, unspecified: Secondary | ICD-10-CM

## 2017-07-04 LAB — COMPREHENSIVE METABOLIC PANEL
ALBUMIN: 4.6 g/dL (ref 3.5–5.0)
ALT: 15 U/L — AB (ref 17–63)
AST: 17 U/L (ref 15–41)
Alkaline Phosphatase: 65 U/L (ref 38–126)
Anion gap: 7 (ref 5–15)
BUN: 13 mg/dL (ref 6–20)
CHLORIDE: 107 mmol/L (ref 101–111)
CO2: 24 mmol/L (ref 22–32)
Calcium: 9.5 mg/dL (ref 8.9–10.3)
Creatinine, Ser: 0.97 mg/dL (ref 0.61–1.24)
GFR calc Af Amer: >60 mL/min (ref >60)
Glucose, Bld: 98 mg/dL (ref 65–99)
POTASSIUM: 3.6 mmol/L (ref 3.5–5.1)
SODIUM: 138 mmol/L (ref 135–145)
Total Bilirubin: 1.1 mg/dL (ref 0.3–1.2)
Total Protein: 7.8 g/dL (ref 6.5–8.1)

## 2017-07-04 LAB — CBC
HEMATOCRIT: 42.8 % (ref 39.0–52.0)
Hemoglobin: 13.7 g/dL (ref 13.0–17.0)
MCH: 20.9 pg — AB (ref 26.0–34.0)
MCHC: 32 g/dL (ref 30.0–36.0)
MCV: 65.2 fL — AB (ref 78.0–100.0)
PLATELETS: 124 10*3/uL — AB (ref 150–400)
RBC: 6.56 MIL/uL — ABNORMAL HIGH (ref 4.22–5.81)
RDW: 17.2 % — ABNORMAL HIGH (ref 11.5–15.5)
WBC: 7.4 10*3/uL (ref 4.0–10.5)

## 2017-07-04 LAB — ABO/RH: ABO/RH(D): A POS

## 2017-07-04 LAB — TYPE AND SCREEN
ABO/RH(D): A POS
Antibody Screen: NEGATIVE

## 2017-07-04 MED ORDER — TRAZODONE HCL 50 MG PO TABS: 50.0000 mg | ORAL_TABLET | Freq: Every day | ORAL | 0 refills | Status: DC

## 2017-07-04 MED ORDER — POLYETHYLENE GLYCOL 3350 17 G PO PACK: 17.0000 g | PACK | Freq: Every day | ORAL | 0 refills | Status: DC

## 2017-07-04 NOTE — ED Provider Notes (Signed)
[facility]-EMERGENCY DEPT Provider Note   CSN: [id number] Arrival date & time: 07/03/17  2255     History   Chief Complaint Chief Complaint  Patient presents with  . Rectal Bleeding    HPI Christopher Foley is a 22 y.o. male.  HPI   22 year old male presents today with complaints of rectal bleeding.  Patient notes that over the last several days he has been constipated and had hard bowel movements.  He notes today he was attempting to strain and had small amount of bright red blood in the toilet.  Patient notes he has had these symptoms in the past.  Denies any associated abdominal pain, fever, diarrhea or vomiting.  Patient denies any significant rectal pain.  Triage note from nurse reports hallucinations after not being on trazodone for the last 3 weeks.  Patient denies any auditory or visual hallucinations, he denies any suicidal or homicidal patient.  Patient's caretaker reports that he has not been sleeping very well after running out of trazodone.  They are in the process of switching medical providers at this time.   Past Medical History:  Diagnosis Date  . Bipolar 1 disorder (HCC)   . History of ADHD     Patient Active Problem List   Diagnosis Date Noted  . Conduct disorder, childhood-onset type 02/09/2013  . ADHD (attention deficit hyperactivity disorder), combined type 02/08/2013  . Bipolar 1 disorder, mixed, moderate (HCC) 01/15/2013    History reviewed. No pertinent surgical history.     Home Medications    Prior to Admission medications   Medication Sig Start Date End Date Taking? Authorizing Provider  ARIPiprazole (ABILIFY) 10 MG tablet Take 1 tablet (10 mg total) by mouth every evening. Patient not taking: Reported on 03/27/2017 01/15/17   Garlon Hatchet, PA-C  ARIPiprazole (ABILIFY) 10 MG tablet Take 1 tablet (10 mg total) by mouth daily. At bedtime Patient not taking: Reported on 03/27/2017 01/15/17   Donnetta Hutching, MD  ARIPiprazole  (ABILIFY) 10 MG tablet Take one tab PO QHS Patient not taking: Reported on 03/27/2017 02/05/17   Jaynie Crumble, PA-C  ARIPiprazole (ABILIFY) 10 MG tablet Take 1 tablet (10 mg total) by mouth daily. 04/19/17   Charm Rings, NP  B Complex Vitamins (VITAMIN B COMPLEX PO) Take 1 tablet by mouth daily.    [provider]  chlorproMAZINE (THORAZINE) 200 MG tablet Take 1 tablet (200 mg total) by mouth at bedtime. Patient not taking: Reported on 03/27/2017 02/17/13   Chauncey Mann, MD  cloNIDine HCl (KAPVAY) 0.1 MG TB12 ER tablet Take 1 tablet (0.1 mg total) by mouth 2 (two) times daily in the am and at bedtime.. taking 2 tablets (0.2 mg total) at bedtime Patient not taking: Reported on 03/27/2017 02/17/13   Chauncey Mann, MD  desmopressin (DDAVP) 0.2 MG tablet Take 1 tablet (0.2 mg total) by mouth at bedtime. Patient not taking: Reported on 03/27/2017 02/17/13   Chauncey Mann, MD  hydrOXYzine (ATARAX/VISTARIL) 25 MG tablet Take 2 tablets (50 mg total) by mouth 3 (three) times daily as needed for anxiety. Patient not taking: Reported on 03/27/2017 01/15/17   Garlon Hatchet, PA-C  hydrOXYzine (ATARAX/VISTARIL) 25 MG tablet Take 1 tablet (25 mg total) by mouth 3 (three) times daily as needed. Take two tabs tid prn anxiety 01/15/17   Donnetta Hutching, MD  hydrOXYzine (ATARAX/VISTARIL) 25 MG tablet Take 2 tablets (50 mg total) by mouth 3 (three) times daily. Patient not taking: Reported  on 03/27/2017 02/05/17   Jaynie Crumble, PA-C  hydrOXYzine (ATARAX/VISTARIL) 25 MG tablet Take 1 tablet (25 mg total) by mouth 3 (three) times daily. 04/19/17   Charm Rings, NP  loratadine (CLARITIN) 10 MG tablet Take 1 tablet (10 mg total) by mouth daily. Patient not taking: Reported on 03/27/2017 02/17/13   Chauncey Mann, MD  OLANZapine (ZYPREXA) 10 MG tablet Take 1 tablet (10 mg total) by mouth 2 (two) times daily. At morning and evening meal Patient not taking: Reported on 03/27/2017 02/17/13    Chauncey Mann, MD  polyethylene glycol [facility]) packet Take 17 g by mouth daily. 07/04/17   Almina Schul, Tinnie Gens, PA-C  topiramate (TOPAMAX) 100 MG tablet Take 1 tablet (100 mg total) by mouth 2 (two) times daily. 04/19/17   Charm Rings, NP  topiramate (TOPAMAX) 25 MG tablet Take 75mg  by mouth in the morning and 50mg  by mouth in the evening. Patient not taking: Reported on 03/27/2017 01/15/17   Garlon Hatchet, PA-C  topiramate (TOPAMAX) 25 MG tablet Take 1 tablet (25 mg total) by mouth every morning. Take with other morning dose of Topamax 50 mg Patient not taking: Reported on 03/27/2017 01/15/17   Donnetta Hutching, MD  topiramate (TOPAMAX) 25 MG tablet Take 3 tab PO in AM, take 2 tabs PO in evening Patient not taking: Reported on 03/27/2017 02/05/17   Jaynie Crumble, PA-C  topiramate (TOPAMAX) 50 MG tablet Take 1 tablet (50 mg total) by mouth 2 (two) times daily. Patient not taking: Reported on 03/27/2017 01/15/17   Donnetta Hutching, MD  traZODone (DESYREL) 50 MG tablet Take 1 tablet (50 mg total) by mouth at bedtime. 07/04/17   Eyvonne Mechanic, PA-C    Family History History reviewed. No pertinent family history.  Social History Social History   Tobacco Use  . Smoking status: Never Smoker  . Smokeless tobacco: Never Used  Substance Use Topics  . Alcohol use: No  . Drug use: No     Allergies   Patient has no known allergies.   Review of Systems Review of Systems  All other systems reviewed and are negative.    Physical Exam Updated Vital Signs BP (!) 149/78 (BP Location: Right Arm)   Pulse (!) 56   Temp 97.9 F (36.6 C) (Oral)   Resp 17   Ht 5\' 11"  (1.803 m)   Wt 101.6 kg (224 lb)   SpO2 100%   BMI 31.24 kg/m   Physical Exam  Constitutional: He is oriented to person, place, and time. He appears well-developed and well-nourished.  HENT:  Head: Normocephalic and atraumatic.  Eyes: Conjunctivae are normal. Pupils are equal, round, and reactive to light. Right eye exhibits no  discharge. Left eye exhibits no discharge. No scleral icterus.  Neck: Normal range of motion. No JVD present. No tracheal deviation present.  Pulmonary/Chest: Effort normal. No stridor.  Abdominal: Soft. He exhibits no distension and no mass. There is no tenderness. There is no rebound and no guarding. No hernia.  Neurological: He is alert and oriented to person, place, and time. Coordination normal.  Psychiatric: He has a normal mood and affect. His behavior is normal. Judgment and thought content normal.  Nursing note and vitals reviewed.    ED Treatments / Results  Labs (all labs ordered are listed, but only abnormal results are displayed) Labs Reviewed  COMPREHENSIVE METABOLIC PANEL - Abnormal; Notable for the following components:      Result Value   ALT 15 (*)  All other components within normal limits  CBC - Abnormal; Notable for the following components:   RBC 6.56 (*)    MCV 65.2 (*)    MCH 20.9 (*)    RDW 17.2 (*)    Platelets 124 (*)    All other components within normal limits  POC OCCULT BLOOD, ED  TYPE AND SCREEN  ABO/RH    EKG  EKG Interpretation None       Radiology No results found.  Procedures Procedures (including critical care time)  Medications Ordered in ED Medications - No data to display   Initial Impression / Assessment and Plan / ED Course  I have reviewed the triage vital signs and the nursing notes.  Pertinent labs & imaging results that were available during my care of the patient were reviewed by me and considered in my medical decision making (see chart for details).      Final Clinical Impressions(s) / ED Diagnoses   Final diagnoses:  Hemorrhoids, unspecified hemorrhoid type  Constipation, unspecified constipation type  Bipolar 1 disorder (HCC)   Labs: CMP, CBC, type and screen, ABO Rh  Imaging:  Consults:  Therapeutics:  Discharge Meds: Trazodone, MiraLAX  Assessment/Plan: 22 year old male presents today with  likely internal hemorrhoids.  I discussed the etiology with the patient given his constipation, I discussed performing a rectal exam, patient would like to defer rectal exam at this time.  I find this completely reasonable, we will try MiraLAX and fiber.  Patient and caregiver agree that if symptoms do not improve with normal bowel movements or he develops any new or worsening signs or symptoms he will return immediately.  Patient also requesting refill of trazodone I find short course of trazodone reasonable until he can follow-up outpatient with primary care.  Patient even strict return precautions, he verbalized understanding and agreement to today's plan had no further questions or concerns at the time of discharge.      ED Discharge Orders        Ordered    traZODone (DESYREL) 50 MG tablet  Daily at bedtime     07/04/17 0442    polyethylene glycol (MIRALAX) packet  Daily     07/04/17 0443       Eyvonne MechanicHedges, Skylene Deremer, PA-C 07/04/17 0443    Eyvonne MechanicHedges, Keaghan Staton, PA-C 07/04/17 0444    Zadie RhineWickline, Donald, MD 07/04/17 [phone]

## 2017-07-04 NOTE — ED Triage Notes (Addendum)
Pt reports having bright red blood in stools for the last few days. Pt reports to possibly be straining when having bowel movement. Caregiver also reports that pt has been having hallucinations and has been out of Trazadone for the last 3 weeks. Pt denies any SI/HI at time of triage

## 2017-07-04 NOTE — Discharge Instructions (Signed)
Please read attached information. If you experience any new or worsening signs or symptoms please return to the emergency room for evaluation. Please follow-up with your primary care provider or specialist as discussed. Please use medication prescribed only as directed and discontinue taking if you have any concerning signs or symptoms.   °

## 2017-07-11 ENCOUNTER — Other Ambulatory Visit: Payer: Self-pay

## 2017-07-11 ENCOUNTER — Emergency Department (HOSPITAL_COMMUNITY)
Admission: EM | Admit: 2017-07-11 | Discharge: 2017-07-11 | Payer: Medicaid Other | Attending: Emergency Medicine | Admitting: Emergency Medicine

## 2017-07-11 ENCOUNTER — Encounter (HOSPITAL_COMMUNITY): Payer: Self-pay | Admitting: Emergency Medicine

## 2017-07-11 DIAGNOSIS — R45850 Homicidal ideations: Secondary | ICD-10-CM | POA: Diagnosis not present

## 2017-07-11 DIAGNOSIS — F69 Unspecified disorder of adult personality and behavior: Secondary | ICD-10-CM

## 2017-07-11 DIAGNOSIS — F319 Bipolar disorder, unspecified: Secondary | ICD-10-CM | POA: Insufficient documentation

## 2017-07-11 LAB — COMPREHENSIVE METABOLIC PANEL
ALK PHOS: 66 U/L (ref 38–126)
ALT: 16 U/L — ABNORMAL LOW (ref 17–63)
AST: 22 U/L (ref 15–41)
Albumin: 4.3 g/dL (ref 3.5–5.0)
Anion gap: 7 (ref 5–15)
BILIRUBIN TOTAL: 0.6 mg/dL (ref 0.3–1.2)
BUN: 11 mg/dL (ref 6–20)
CALCIUM: 9.4 mg/dL (ref 8.9–10.3)
CO2: 26 mmol/L (ref 22–32)
Chloride: 107 mmol/L (ref 101–111)
Creatinine, Ser: 0.98 mg/dL (ref 0.61–1.24)
Glucose, Bld: 100 mg/dL — ABNORMAL HIGH (ref 65–99)
POTASSIUM: 3.6 mmol/L (ref 3.5–5.1)
Sodium: 140 mmol/L (ref 135–145)
Total Protein: 7.2 g/dL (ref 6.5–8.1)

## 2017-07-11 LAB — CBC WITH DIFFERENTIAL/PLATELET
BASOS ABS: 0.1 10*3/uL (ref 0.0–0.1)
Basophils Relative: 1 %
EOS ABS: 0.3 10*3/uL (ref 0.0–0.7)
Eosinophils Relative: 5 %
HCT: 41.5 % (ref 39.0–52.0)
Hemoglobin: 13.1 g/dL (ref 13.0–17.0)
LYMPHS PCT: 25 %
Lymphs Abs: 1.5 10*3/uL (ref 0.7–4.0)
MCH: 21.1 pg — AB (ref 26.0–34.0)
MCHC: 31.6 g/dL (ref 30.0–36.0)
MCV: 66.8 fL — ABNORMAL LOW (ref 78.0–100.0)
MONO ABS: 0.4 10*3/uL (ref 0.1–1.0)
Monocytes Relative: 6 %
NEUTROS ABS: 3.8 10*3/uL (ref 1.7–7.7)
NEUTROS PCT: 63 %
PLATELETS: 101 10*3/uL — AB (ref 150–400)
RBC: 6.21 MIL/uL — ABNORMAL HIGH (ref 4.22–5.81)
RDW: 17 % — ABNORMAL HIGH (ref 11.5–15.5)
WBC: 6.1 10*3/uL (ref 4.0–10.5)

## 2017-07-11 LAB — SALICYLATE LEVEL

## 2017-07-11 LAB — ETHANOL

## 2017-07-11 LAB — ACETAMINOPHEN LEVEL: Acetaminophen (Tylenol), Serum: <10 ug/mL — ABNORMAL LOW (ref 10–30)

## 2017-07-11 MED ORDER — B COMPLEX-C PO TABS: 1.0000 | ORAL_TABLET | Freq: Every day | ORAL | Status: DC

## 2017-07-11 MED ORDER — ZIPRASIDONE MESYLATE 20 MG IM SOLR
INTRAMUSCULAR | Status: AC
  Administered 2017-07-11: 10 mg
  Filled 2017-07-11: qty 20

## 2017-07-11 MED ORDER — TRAZODONE HCL 50 MG PO TABS: 50.0000 mg | ORAL_TABLET | Freq: Every day | ORAL | Status: DC

## 2017-07-11 MED ORDER — STERILE WATER FOR INJECTION IJ SOLN
INTRAMUSCULAR | Status: AC
  Administered 2017-07-11: 10 mL
  Filled 2017-07-11: qty 10

## 2017-07-11 MED ORDER — LORAZEPAM 2 MG/ML IJ SOLN
INTRAMUSCULAR | Status: AC
  Administered 2017-07-11: 2 mg
  Filled 2017-07-11: qty 1

## 2017-07-11 MED ORDER — STERILE WATER FOR INJECTION IJ SOLN
INTRAMUSCULAR | Status: AC
  Administered 2017-07-11: 2 mL
  Filled 2017-07-11: qty 10

## 2017-07-11 MED ORDER — ZIPRASIDONE MESYLATE 20 MG IM SOLR
10.0000 mg | Freq: Once | INTRAMUSCULAR | Status: AC
  Administered 2017-07-11: 10 mg via INTRAMUSCULAR
  Filled 2017-07-11: qty 20

## 2017-07-11 MED ORDER — ARIPIPRAZOLE 10 MG PO TABS
10.0000 mg | ORAL_TABLET | Freq: Every day | ORAL | Status: DC
Start: 2017-07-11 — End: 2017-07-11

## 2017-07-11 MED ORDER — HYDROXYZINE HCL 25 MG PO TABS: 25.0000 mg | ORAL_TABLET | Freq: Three times a day (TID) | ORAL | Status: DC
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
## 2017-07-11 NOTE — ED Notes (Signed)
Pt has verbalized that he is ready to go home or go to jail at this time.
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
## 2017-07-11 NOTE — ED Notes (Signed)
Per TTS:  Pt is to be discharged to law enforcement upon waking up.
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
## 2017-07-11 NOTE — BH Assessment (Signed)
[facility] Assessment Progress Note  Per Nira ConnJason Berry, NP pt is recommended to be d/c to [employer] as [employer] reported to this Clinical research associatewriter that the pt is under arrest. EDP Allyne GeeSanders, Rosezella FloridaLisa M, PA-C and pt's nurse Isaias CowmanAllan, RN advised. [employer] at bedside advised to follow their protocol whenever an inmate is suicidal. LEO stated to this Clinical research associatewriter they have protocol for psych patients in the jail. Pt to be d/c with [employer].  Princess BruinsAquicha Devereaux Grayson, MSW, LCSW Therapeutic Triage Specialist  [phone]

## 2017-10-11 ENCOUNTER — Encounter (HOSPITAL_COMMUNITY): Payer: Self-pay | Admitting: Emergency Medicine

## 2017-10-11 ENCOUNTER — Emergency Department (HOSPITAL_COMMUNITY)
Admission: EM | Admit: 2017-10-11 | Discharge: 2017-10-13 | Disposition: A | Discharge status: Home / Self Care | Payer: Medicaid Other | Attending: Emergency Medicine | Admitting: Emergency Medicine

## 2017-10-11 DIAGNOSIS — F319 Bipolar disorder, unspecified: Secondary | ICD-10-CM | POA: Insufficient documentation

## 2017-10-11 DIAGNOSIS — F32A Depression, unspecified: Secondary | ICD-10-CM

## 2017-10-11 DIAGNOSIS — R45851 Suicidal ideations: Secondary | ICD-10-CM | POA: Diagnosis not present

## 2017-10-11 DIAGNOSIS — Z5900 Homelessness unspecified: Secondary | ICD-10-CM

## 2017-10-11 DIAGNOSIS — F329 Major depressive disorder, single episode, unspecified: Secondary | ICD-10-CM

## 2017-10-11 DIAGNOSIS — Z79899 Other long term (current) drug therapy: Secondary | ICD-10-CM | POA: Diagnosis not present

## 2017-10-11 LAB — COMPREHENSIVE METABOLIC PANEL
ALK PHOS: 58 U/L (ref 38–126)
ALT: 25 U/L (ref 17–63)
AST: 26 U/L (ref 15–41)
Albumin: 4.3 g/dL (ref 3.5–5.0)
Anion gap: 9 (ref 5–15)
BILIRUBIN TOTAL: 0.8 mg/dL (ref 0.3–1.2)
BUN: 18 mg/dL (ref 6–20)
CALCIUM: 9.5 mg/dL (ref 8.9–10.3)
CO2: 23 mmol/L (ref 22–32)
Chloride: 109 mmol/L (ref 101–111)
Creatinine, Ser: 1.17 mg/dL (ref 0.61–1.24)
GFR calc Af Amer: >60 mL/min (ref >60)
GFR calc non Af Amer: >60 mL/min (ref >60)
Glucose, Bld: 82 mg/dL (ref 65–99)
POTASSIUM: 4.2 mmol/L (ref 3.5–5.1)
SODIUM: 141 mmol/L (ref 135–145)
Total Protein: 7.5 g/dL (ref 6.5–8.1)

## 2017-10-11 LAB — CBC WITH DIFFERENTIAL/PLATELET
BASOS PCT: 1 %
Basophils Absolute: 0.1 10*3/uL (ref 0.0–0.1)
EOS ABS: 0.2 10*3/uL (ref 0.0–0.7)
EOS PCT: 2 %
HCT: 42.1 % (ref 39.0–52.0)
HEMOGLOBIN: 13.5 g/dL (ref 13.0–17.0)
LYMPHS PCT: 15 %
Lymphs Abs: 1.1 10*3/uL (ref 0.7–4.0)
MCH: 21.3 pg — AB (ref 26.0–34.0)
MCHC: 32.1 g/dL (ref 30.0–36.0)
MCV: 66.4 fL — AB (ref 78.0–100.0)
Monocytes Absolute: 0.4 10*3/uL (ref 0.1–1.0)
Monocytes Relative: 5 %
NEUTROS ABS: 5.8 10*3/uL (ref 1.7–7.7)
Neutrophils Relative %: 77 %
Platelets: 122 10*3/uL — ABNORMAL LOW (ref 150–400)
RBC: 6.34 MIL/uL — ABNORMAL HIGH (ref 4.22–5.81)
RDW: 17.2 % — ABNORMAL HIGH (ref 11.5–15.5)
WBC: 7.6 10*3/uL (ref 4.0–10.5)

## 2017-10-11 LAB — ETHANOL: Alcohol, Ethyl (B): <10 mg/dL (ref <10)

## 2017-10-11 MED ORDER — ZOLPIDEM TARTRATE 5 MG PO TABS
5.0000 mg | ORAL_TABLET | Freq: Every evening | ORAL | Status: DC | PRN
  Administered 2017-10-11 – 2017-10-12 (×2): 5 mg via ORAL
  Filled 2017-10-11 (×2): qty 1

## 2017-10-11 MED ORDER — IBUPROFEN 200 MG PO TABS: 600.0000 mg | ORAL_TABLET | Freq: Three times a day (TID) | ORAL | Status: DC | PRN

## 2017-10-11 MED ORDER — ONDANSETRON HCL 4 MG PO TABS: 4.0000 mg | ORAL_TABLET | Freq: Three times a day (TID) | ORAL | Status: DC | PRN

## 2017-10-11 NOTE — ED Provider Notes (Signed)
[facility]-EMERGENCY DEPT Provider Note   CSN: [id number] Arrival date & time: 10/11/17  1649     History   Chief Complaint Chief Complaint  Patient presents with  . Suicidal    HPI Christopher Foley is a 22 y.o. male.  The history is provided by the patient. No language interpreter was used.  Depression  This is a recurrent problem. The current episode started 6 to 12 hours ago. The problem occurs constantly. The problem has not changed since onset.Nothing aggravates the symptoms. Nothing relieves the symptoms. He has tried nothing for the symptoms. The treatment provided no relief.  Pt reports a history of mental illness.  Pt reports his family has kicked hm out.  Pt decided to hurt himself today by hitting himself in the head. Pt reports suicidal thoughts.    Past Medical History:  Diagnosis Date  . Bipolar 1 disorder (HCC)   . History of ADHD     Patient Active Problem List   Diagnosis Date Noted  . Conduct disorder, childhood-onset type 02/09/2013  . ADHD (attention deficit hyperactivity disorder), combined type 02/08/2013  . Bipolar 1 disorder, mixed, moderate (HCC) 01/15/2013    History reviewed. No pertinent surgical history.      Home Medications    Prior to Admission medications   Medication Sig Start Date End Date Taking? Authorizing Provider  ARIPiprazole (ABILIFY) 10 MG tablet Take 1 tablet (10 mg total) by mouth daily. 04/19/17   Charm RingsLord, Jamison Y, NP  B Complex Vitamins (VITAMIN B COMPLEX PO) Take 1 tablet by mouth daily.    [provider]  hydrOXYzine (ATARAX/VISTARIL) 25 MG tablet Take 1 tablet (25 mg total) by mouth 3 (three) times daily. 04/19/17   Charm RingsLord, Jamison Y, NP  polyethylene glycol [facility](MIRALAX) packet Take 17 g by mouth daily. 07/04/17   Hedges, Tinnie GensJeffrey, PA-C  topiramate (TOPAMAX) 100 MG tablet Take 1 tablet (100 mg total) by mouth 2 (two) times daily. 04/19/17   Charm RingsLord, Jamison Y, NP  traZODone (DESYREL) 50 MG tablet Take  1 tablet (50 mg total) by mouth at bedtime. 07/04/17   Eyvonne MechanicHedges, Jeffrey, PA-C    Family History No family history on file.  Social History Social History   Tobacco Use  . Smoking status: Never Smoker  . Smokeless tobacco: Never Used  Substance Use Topics  . Alcohol use: No  . Drug use: No     Allergies   Patient has no known allergies.   Review of Systems Review of Systems  Psychiatric/Behavioral: Positive for depression.  All other systems reviewed and are negative.    Physical Exam Updated Vital Signs BP (!) 147/84 (BP Location: Left Arm)   Pulse (!) 52   Temp 97.7 F (36.5 C) (Oral)   Resp 18   SpO2 100%   Physical Exam  Constitutional: He appears well-developed and well-nourished.  HENT:  Head: Normocephalic and atraumatic.  Right Ear: External ear normal.  Left Ear: External ear normal.  Nose: Nose normal.  Mouth/Throat: Oropharynx is clear and moist.  No evidence of head injury  Eyes: Conjunctivae are normal.  Neck: Neck supple.  Cardiovascular: Normal rate and regular rhythm.  No murmur heard. Pulmonary/Chest: Effort normal and breath sounds normal. No respiratory distress.  Abdominal: Soft. There is no tenderness.  Musculoskeletal: He exhibits no edema.  Neurological: He is alert.  Skin: Skin is warm and dry.  Psychiatric: He has a normal mood and affect.  Nursing note and vitals reviewed.  ED Treatments / Results  Labs (all labs ordered are listed, but only abnormal results are displayed) Labs Reviewed  COMPREHENSIVE METABOLIC PANEL  ETHANOL  RAPID URINE DRUG SCREEN, HOSP PERFORMED  CBC WITH DIFFERENTIAL/PLATELET    EKG None  Radiology No results found.  Procedures Procedures (including critical care time)  Medications Ordered in ED Medications - No data to display   Initial Impression / Assessment and Plan / ED Course  I have reviewed the triage vital signs and the nursing notes.  Pertinent labs & imaging results that  were available during my care of the patient were reviewed by me and considered in my medical decision making (see chart for details).     TTS to consult. TTS feels pt is safe to discharge.  They advised social work consult.   Final Clinical Impressions(s) / ED Diagnoses   Final diagnoses:  Depression, unspecified depression type  Homeless    ED Discharge Orders    None       Osie Cheeks 10/11/17 2145    Phillis Haggis, MD 10/11/17 2146
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
## 2017-10-11 NOTE — ED Notes (Signed)
Patient admitted on unit, cheerful and polite. Denies pain, SI/HI,AH/VH at this time. Patient contracts for safety. Verbalized no concern. No behavior issues noted.  Staff offered support and encouragement as needed. Routine safety checks maintained. Will continue to monitor patient.
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
## 2017-10-11 NOTE — ED Notes (Signed)
Got a call from Jaquita Rectorracy Augustine [phone] #[phone]  Crisis line requested that patient be allowed to stay overnight to enable her make the necessary arrangement for where patient will be staying after discharge. [facility] aware of this arrangement.

## 2017-10-11 NOTE — Progress Notes (Signed)
Consult request has been received. CSW attempting to follow up at present time.  Per pt's RN, pt's advocate Christopher Foley with [facility] requested that the pt who is homeless be allowed to remain in the ED overnight and be D/C'd on Tuesday 4/15 after Christopher Foley facilites the pt's return to his brother's house.  Per Christopher Foley, the pt is homeless and the pt's brother had kicked the pt out.  Per pt's RN the EDP was agreeable to this.  Per pt's RN the pt has a Engineer, drillingprobation officer at ph: [phone] who must be notifed of the pt's location ASAP.  2nd shift ED CSW will leave handoff for 1st shift ED CSW.  PLAN: On 4/15 the pt will be D/C'd back to his brother's house with the help of pt's advocate Christopher Foley with [facility] and please notify the pt's PO at ph: [phone] with the permission of the pt of the pt's location.  Please reconsult if future social work needs arise.  CSW signing off, as social work intervention is no longer needed.  Dorothe PeaJonathan F. Michaeljames Milnes, LCSW, LCAS, CSI Clinical Social Worker Ph: [phone]

## 2017-10-12 LAB — RAPID URINE DRUG SCREEN, HOSP PERFORMED
AMPHETAMINES: NOT DETECTED
BENZODIAZEPINES: NOT DETECTED
Barbiturates: NOT DETECTED
COCAINE: NOT DETECTED
Opiates: NOT DETECTED
TETRAHYDROCANNABINOL: NOT DETECTED

## 2017-10-12 NOTE — ED Notes (Signed)
Pt A&O x 3, no distress noted, calm & cooperative, watching TV at present.  Monitoring for safety, Q 15 min checks in effect. 

## 2017-10-12 NOTE — ED Notes (Signed)
Patient up using phone
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
## 2017-10-13 NOTE — NC FL2 (Signed)
  [place] MEDICAID FL2 LEVEL OF CARE SCREENING TOOL     IDENTIFICATION  Patient Name: Christopher Foley Birthdate: 02/26/1996 Sex: male Admission Date (Current Location): 10/11/2017  [facility] [place]   Facility and Address:  [facility],  [address], [place] [id number]      Provider Number: [id number]  Attending Physician Name and Address:  Default, Provider, MD  Relative Name and Phone Number:       Current Level of Care: Hospital Recommended Level of Care: Other (Comment)(Group Living High ) Prior Approval Number:    Date Approved/Denied:   PASRR Number:    Discharge Plan: Other (Comment)(Group Living High )    Current Diagnoses: Patient Active Problem List   Diagnosis Date Noted  . Conduct disorder, childhood-onset type 02/09/2013  . ADHD (attention deficit hyperactivity disorder), combined type 02/08/2013  . Bipolar 1 disorder, mixed, moderate (HCC) 01/15/2013    Orientation RESPIRATION BLADDER Height & Weight     Self, Time, Situation, Place  Normal Continent Weight:   Height:     BEHAVIORAL SYMPTOMS/MOOD NEUROLOGICAL BOWEL NUTRITION STATUS      Continent Diet(Regular)  AMBULATORY STATUS COMMUNICATION OF NEEDS Skin   Independent Verbally Normal                       Personal Care Assistance Level of Assistance  Bathing, Feeding, Dressing Bathing Assistance: Independent Feeding assistance: Independent Dressing Assistance: Independent     Functional Limitations Info             SPECIAL CARE FACTORS FREQUENCY                       Contractures      Additional Factors Info  Code Status, Allergies Code Status Info: Full Code  Allergies Info: NKA            Current Medications (10/13/2017):  This is the current hospital active medication list Current Facility-Administered Medications  Medication Dose Route Frequency Provider Last Rate Last Dose  . ibuprofen (ADVIL,MOTRIN) tablet 600 mg  600 mg  Oral Q8H PRN Sofia, Leslie K, PA-C      . ondansetron [facility](ZOFRAN) tablet 4 mg  4 mg Oral Q8H PRN Sofia, Leslie K, PA-C      . zolpidem (AMBIEN) tablet 5 mg  5 mg Oral QHS PRN Sofia, Leslie K, PA-C   5 mg at 10/12/17 2116   Current Outpatient Medications  Medication Sig Dispense Refill  . ARIPiprazole (ABILIFY) 10 MG tablet Take 1 tablet (10 mg total) by mouth daily. 30 tablet 0  . B Complex Vitamins (VITAMIN B COMPLEX PO) Take 1 tablet by mouth daily.    . hydrOXYzine (ATARAX/VISTARIL) 25 MG tablet Take 1 tablet (25 mg total) by mouth 3 (three) times daily. 90 tablet 0  . polyethylene glycol (MIRALAX) packet Take 17 g by mouth daily. 14 each 0  . topiramate (TOPAMAX) 100 MG tablet Take 1 tablet (100 mg total) by mouth 2 (two) times daily. 60 tablet 0  . traZODone (DESYREL) 50 MG tablet Take 1 tablet (50 mg total) by mouth at bedtime. 15 tablet 0     Discharge Medications: Please see discharge summary for a list of discharge medications.  Relevant Imaging Results:  Relevant Lab Results:   Additional Information  SS#: [id number]  Donnie CoffinErin M Dollene Mallery, LCSW

## 2017-10-13 NOTE — Progress Notes (Signed)
Conference call was completed with [facility] and legal guardian on Tuesday 4/16 regarding current barriers to discharge. Patient lost funding in 10/18 and currently will not receive IDD/ mental health funding from [facility] due to IQ. Patient no longer receives disability. Patients mother is currently working on re-starting his disability. Patients most recent CCA states patient will receive ACT Services- in order to place patient into a facility/group home, CCA would need to state Group Living High needed.   Due to barriers with funding/ CCA, patient is unable to be placed from ED at this time. CSW informed care coordinator/ legal guardian of potential plans on 4/16- both voiced concerns with this plan however understood this was our only option at the time.   CSW has updated social work Chiropodistassistant director (Zack Brooks); Interior and spatial designerdirector [facility](Hope Rife); and Wellsite geologistmedical director( Dr. Jacky KindleAronson) of current barriers and discharge plans- all parties agree that at this time we are unable to place patient and shelter resources will be provided. CSW notified care coordinator, [name]; legal guardian, Kenney Housemananya; and [place] [facility] coordinator, Becky SaxKrystal Graves, of disposition to [facility] [place]. Legal guardian voiced concerns with patient discharging to a shelter however stated "he is grown and needs to figure this out".   CSW provided patient with PART bus pass and shelter list, including; [facility], [facility], and [facility]( patient [facility] be [facility] in the event it is needed). Patient also received [facility] services. Patient was provided contact information to care coordinator, [name], in the event he has any questions or concerns with funding. Patient discharged with transportation to [facility] via [employer].   Please refer to [facility] for helpful contacts.    Stacy GardnerErin Darris Staiger, [facility] Emergency Room Clinical Social Worker [phone]

## 2017-10-13 NOTE — ED Notes (Signed)
Pt has been quiet and stayed in room much of the shift, calm & cooperative, no aggressive behavior noted.  Denies SI.

## 2017-10-13 NOTE — ED Notes (Signed)
Pt discharged home. Discharged instructions read to pt who verbalized understanding. All belongings returned to pt who signed for same. Denies SI/HI, is not delusional and not responding to internal stimuli. Escorted pt to the ED exit.  [place] and then [place] court [place] tomorrow. He appeared to understand the instructions.

## 2019-09-15 ENCOUNTER — Observation Stay (HOSPITAL_COMMUNITY)
Admission: RE | Admit: 2019-09-15 | Discharge: 2019-09-16 | Disposition: A | Discharge status: Home / Self Care | Payer: Medicaid Other | Attending: Psychiatry | Admitting: Psychiatry

## 2019-09-15 DIAGNOSIS — F332 Major depressive disorder, recurrent severe without psychotic features: Principal | ICD-10-CM | POA: Insufficient documentation

## 2019-09-15 DIAGNOSIS — R45851 Suicidal ideations: Secondary | ICD-10-CM | POA: Insufficient documentation

## 2019-09-15 DIAGNOSIS — Z5900 Homelessness unspecified: Secondary | ICD-10-CM

## 2019-09-15 DIAGNOSIS — Z79899 Other long term (current) drug therapy: Secondary | ICD-10-CM | POA: Insufficient documentation

## 2019-09-15 DIAGNOSIS — F4310 Post-traumatic stress disorder, unspecified: Secondary | ICD-10-CM | POA: Insufficient documentation

## 2019-09-15 DIAGNOSIS — Z20822 Contact with and (suspected) exposure to covid-19: Secondary | ICD-10-CM | POA: Diagnosis not present

## 2019-09-15 DIAGNOSIS — F4323 Adjustment disorder with mixed anxiety and depressed mood: Secondary | ICD-10-CM | POA: Diagnosis not present

## 2019-09-15 DIAGNOSIS — F4321 Adjustment disorder with depressed mood: Secondary | ICD-10-CM

## 2019-09-15 DIAGNOSIS — G479 Sleep disorder, unspecified: Secondary | ICD-10-CM | POA: Insufficient documentation

## 2019-09-15 LAB — RESPIRATORY PANEL BY RT PCR (FLU A&B, COVID)
Influenza A by PCR: NEGATIVE
Influenza B by PCR: NEGATIVE
SARS Coronavirus 2 by RT PCR: NEGATIVE
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
## 2019-09-16 ENCOUNTER — Encounter (HOSPITAL_COMMUNITY): Payer: Self-pay | Admitting: Nurse Practitioner

## 2019-09-16 ENCOUNTER — Other Ambulatory Visit: Payer: Self-pay

## 2019-09-16 DIAGNOSIS — F4323 Adjustment disorder with mixed anxiety and depressed mood: Secondary | ICD-10-CM | POA: Diagnosis not present

## 2019-09-16 DIAGNOSIS — Z5900 Homelessness unspecified: Secondary | ICD-10-CM

## 2019-09-16 DIAGNOSIS — F332 Major depressive disorder, recurrent severe without psychotic features: Secondary | ICD-10-CM | POA: Diagnosis not present

## 2019-09-16 DIAGNOSIS — F4321 Adjustment disorder with depressed mood: Secondary | ICD-10-CM

## 2019-09-16 LAB — COMPREHENSIVE METABOLIC PANEL
ALT: 19 U/L (ref 0–44)
AST: 21 U/L (ref 15–41)
Albumin: 4.1 g/dL (ref 3.5–5.0)
Alkaline Phosphatase: 57 U/L (ref 38–126)
Anion gap: 9 (ref 5–15)
BUN: 12 mg/dL (ref 6–20)
CO2: 27 mmol/L (ref 22–32)
Calcium: 9.2 mg/dL (ref 8.9–10.3)
Chloride: 106 mmol/L (ref 98–111)
Creatinine, Ser: 0.95 mg/dL (ref 0.61–1.24)
GFR calc Af Amer: >60 mL/min (ref >60)
GFR calc non Af Amer: >60 mL/min (ref >60)
Glucose, Bld: 94 mg/dL (ref 70–99)
Potassium: 3.8 mmol/L (ref 3.5–5.1)
Sodium: 142 mmol/L (ref 135–145)
Total Bilirubin: 1.2 mg/dL (ref 0.3–1.2)
Total Protein: 7 g/dL (ref 6.5–8.1)

## 2019-09-16 LAB — CBC
HCT: 43.3 % (ref 39.0–52.0)
Hemoglobin: 12.9 g/dL — ABNORMAL LOW (ref 13.0–17.0)
MCH: 21.3 pg — ABNORMAL LOW (ref 26.0–34.0)
MCHC: 29.8 g/dL — ABNORMAL LOW (ref 30.0–36.0)
MCV: 71.3 fL — ABNORMAL LOW (ref 80.0–100.0)
Platelets: 109 10*3/uL — ABNORMAL LOW (ref 150–400)
RBC: 6.07 MIL/uL — ABNORMAL HIGH (ref 4.22–5.81)
RDW: 17.8 % — ABNORMAL HIGH (ref 11.5–15.5)
WBC: 4.4 10*3/uL (ref 4.0–10.5)
nRBC: 0 % (ref 0.0–0.2)

## 2019-09-16 MED ORDER — ACETAMINOPHEN 325 MG PO TABS: 650.0000 mg | ORAL_TABLET | Freq: Four times a day (QID) | ORAL | Status: DC | PRN

## 2019-09-16 MED ORDER — HYDROXYZINE HCL 25 MG PO TABS: 25.0000 mg | ORAL_TABLET | Freq: Three times a day (TID) | ORAL | Status: DC | PRN

## 2019-09-16 MED ORDER — ALUM & MAG HYDROXIDE-SIMETH 200-200-20 MG/5ML PO SUSP: 30.0000 mL | ORAL | Status: DC | PRN

## 2019-09-16 MED ORDER — TRAZODONE HCL 50 MG PO TABS: 50.0000 mg | ORAL_TABLET | Freq: Every evening | ORAL | Status: DC | PRN

## 2019-09-16 MED ORDER — MAGNESIUM HYDROXIDE 400 MG/5ML PO SUSP: 30.0000 mL | Freq: Every day | ORAL | Status: DC | PRN

## 2019-09-16 NOTE — Progress Notes (Signed)
Admission Note:  Patient is admitted voluntary to observation unit. He is alert, oriented and ambulatory. Patient is admitted for SI and will not share his plan with this Clinical research associate. Patient denies HI and A/V hallucinations. Patient gait is steady and he is cooperative with admission process. Patient provided support and encouragement. Q 15 minute checks in progress and patient remains safe on unit. Monitoring continues.
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
## 2019-09-16 NOTE — Plan of Care (Signed)
[facility] Observation Crisis Plan  Reason for Crisis Plan:  Chronic Mental Illness/Medical Illness, Crisis Stabilization and Medication Management   Plan of Care:  Referral for Inpatient Hospitalization and Referral for Telepsychiatry/Psychiatric Consult  Family Support:      Current Living Environment:  Living Arrangements: Other (Comment)(Homeless)  Insurance:   Hospital Account    Name Acct ID Class Status Primary Coverage   [place], [place]olfo [id number] BEHAVIORAL HEALTH OBSERVATION Open [employer]        Guarantor Account (for Hospital Account [id number])    Name Relation to Pt Service Area Active? Acct Type   Cleatis Polka Self [facility] Yes [facility]   Address Phone       [address] [place], [place] [address] [phone](H)          Coverage Information (for Hospital Account [id number])    F/O Payor/Plan Precert #   [employer]    Subscriber Subscriber #   [place], [place] [id number]   Address Phone   [address] [place], [place] [address] [phone]      Legal Guardian:  Legal Guardian: Other:(Self)  Primary Care Provider:  Patient, No Pcp Per  Current Outpatient Providers:  N/A  Psychiatrist:  Name of Psychiatrist: None  Counselor/Therapist:  Name of Therapist: None  Compliant with Medications:  No  Additional Information:   Tania Ade 3/20/20218:30 AM

## 2019-09-16 NOTE — Progress Notes (Signed)
CSW met with pt and delivered outpatient resources for housing needs.   Darletta Moll MSW, [place] Worker Disposition  [facility] Ph: [phone] Fax: [phone]
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
## 2019-09-16 NOTE — Progress Notes (Signed)
Patient ID: Christopher Foley, male   DOB: 11-24-1995, 24 y.o.   MRN: [id number]  Pt alert and oriented on observation unit. Education, support, and encouragement provided. Discharge summary, medications and follow up appointments reviewed with pt. Suicide prevention resources provided, including "My 3 App." Pt's belongings returned. Pt denies SI/HI, A/VH, pain, or any concerns at this time. Pt ambulatory on the unit. Pt discharged to lobby.

## 2019-09-17 ENCOUNTER — Encounter (HOSPITAL_COMMUNITY): Payer: Self-pay

## 2019-09-17 ENCOUNTER — Emergency Department (HOSPITAL_COMMUNITY)
Admission: EM | Admit: 2019-09-17 | Discharge: 2019-09-17 | Disposition: A | Discharge status: Home / Self Care | Payer: Medicaid Other | Attending: Emergency Medicine | Admitting: Emergency Medicine

## 2019-09-17 DIAGNOSIS — R45851 Suicidal ideations: Secondary | ICD-10-CM | POA: Diagnosis not present

## 2019-09-17 DIAGNOSIS — F329 Major depressive disorder, single episode, unspecified: Secondary | ICD-10-CM | POA: Diagnosis present

## 2019-09-17 DIAGNOSIS — F909 Attention-deficit hyperactivity disorder, unspecified type: Secondary | ICD-10-CM | POA: Insufficient documentation

## 2019-09-17 DIAGNOSIS — F4323 Adjustment disorder with mixed anxiety and depressed mood: Secondary | ICD-10-CM | POA: Insufficient documentation

## 2019-09-17 DIAGNOSIS — Z5900 Homelessness unspecified: Secondary | ICD-10-CM

## 2019-09-17 DIAGNOSIS — F4321 Adjustment disorder with depressed mood: Secondary | ICD-10-CM

## 2019-09-17 DIAGNOSIS — F319 Bipolar disorder, unspecified: Secondary | ICD-10-CM | POA: Insufficient documentation

## 2019-09-17 LAB — RAPID URINE DRUG SCREEN, HOSP PERFORMED
Amphetamines: NOT DETECTED
Barbiturates: NOT DETECTED
Benzodiazepines: NOT DETECTED
Cocaine: NOT DETECTED
Opiates: NOT DETECTED
Tetrahydrocannabinol: NOT DETECTED

## 2019-09-17 LAB — ETHANOL: Alcohol, Ethyl (B): <10 mg/dL (ref <10)

## 2019-09-17 NOTE — ED Notes (Signed)
Pt is lying in bed, eye's closed, chest rising and falling. NAD noted. Will continue to monitor.

## 2019-09-17 NOTE — ED Provider Notes (Signed)
[facility] DEPT Provider Note   CSN: [id number] Arrival date & time: 09/17/19  0002     History Chief Complaint  Patient presents with  . Suicidal  . Drug Overdose    Christopher Foley is a 24 y.o. male.  Patient to ED stating he took 300 mg ibuprofen in a suicide attempt. He states he wasn't feeling safe and was feeling like there wasn't an answer. Per review of medical records, he was under observation at [facility] until yesterday being discharged stating he was better and not suicidal. No HI, hallucinations.   The history is provided by the patient. No language interpreter was used.       Past Medical History:  Diagnosis Date  . Bipolar 1 disorder ([place])   . History of ADHD     Patient Active Problem List   Diagnosis Date Noted  . Severe recurrent major depression without psychotic features ([place]) 09/16/2019  . Adjustment disorder with mixed anxiety and depressed mood 09/16/2019  . Homelessness 09/16/2019  . Conduct disorder, childhood-onset type 02/09/2013  . ADHD (attention deficit hyperactivity disorder), combined type 02/08/2013  . Bipolar 1 disorder, mixed, moderate ([place]) 01/15/2013    History reviewed. No pertinent surgical history.     History reviewed. No pertinent family history.  Social History   Tobacco Use  . Smoking status: Never Smoker  . Smokeless tobacco: Never Used  Substance Use Topics  . Alcohol use: No  . Drug use: No    Home Medications Prior to Admission medications   Not on File    Allergies    Patient has no known allergies.  Review of Systems   Review of Systems  Constitutional: Negative for chills and fever.  HENT: Negative.   Respiratory: Negative.   Cardiovascular: Negative.   Gastrointestinal: Negative.   Musculoskeletal: Negative.   Skin: Negative.   Neurological: Negative.   Psychiatric/Behavioral: Positive for suicidal ideas.    Physical Exam Updated Vital Signs BP (!) 155/79   Pulse  (!) 59   Temp 98.5 F (36.9 C) (Oral)   Resp 16   Ht 5\' 9"  (1.753 m)   Wt 104.8 kg   SpO2 99%   BMI 34.11 kg/m   Physical Exam Vitals and nursing note reviewed.  Constitutional:      Appearance: He is well-developed.  HENT:     Head: Normocephalic.  Cardiovascular:     Rate and Rhythm: Normal rate and regular rhythm.  Pulmonary:     Effort: Pulmonary effort is normal.     Breath sounds: Normal breath sounds.  Abdominal:     General: Bowel sounds are normal.     Palpations: Abdomen is soft.     Tenderness: There is no abdominal tenderness. There is no guarding or rebound.  Musculoskeletal:        General: Normal range of motion.     Cervical back: Normal range of motion and neck supple.  Skin:    General: Skin is warm and dry.     Findings: No rash.  Neurological:     Mental Status: He is alert and oriented to person, place, and time.     ED Results / Procedures / Treatments   Labs (all labs ordered are listed, but only abnormal results are displayed) Labs Reviewed  RAPID URINE DRUG SCREEN, HOSP PERFORMED  ETHANOL   Results for orders placed or performed during the hospital encounter of 09/17/19  Urine rapid drug screen (hosp performed)  Result Value Ref  Range   Opiates NONE DETECTED NONE DETECTED   Cocaine NONE DETECTED NONE DETECTED   Benzodiazepines NONE DETECTED NONE DETECTED   Amphetamines NONE DETECTED NONE DETECTED   Tetrahydrocannabinol NONE DETECTED NONE DETECTED   Barbiturates NONE DETECTED NONE DETECTED  Ethanol  Result Value Ref Range   Alcohol, Ethyl (B) <10 <10 mg/dL   EKG None  Radiology No results found.  Procedures Procedures (including critical care time)  Medications Ordered in ED Medications - No data to display  ED Course  I have reviewed the triage vital signs and the nursing notes.  Pertinent labs & imaging results that were available during my care of the patient were reviewed by me and considered in my medical decision  making (see chart for details).    MDM Rules/Calculators/A&P                      Patient returns to ED with SI, taking 300 mg ibuprofen in a self injury attempt.   TTS consulted to determine appropriate disposition.   Final Clinical Impression(s) / ED Diagnoses Final diagnoses:  None   1. SI  Rx / DC Orders ED Discharge Orders    None       Elpidio Anis, PA-C 09/21/19 0636    Shon Baton, MD 09/21/19 [phone]

## 2019-09-17 NOTE — Progress Notes (Signed)
Referral created for [facility] community resource pool. Patient provided with bus pass for ride to his next destination.   No further needs at this time   Vikas Wegmann Tomma Rakers Transitions of Care  Clinical Social Worker  Ph: [phone]

## 2019-09-17 NOTE — ED Triage Notes (Signed)
Pt brought in by EMS for SI with attempts of overdose. Pt states he took 300mg  of motrin.
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
## 2019-09-17 NOTE — ED Notes (Signed)
Pt changed into wine scrubs, wanded by security, and his belongings placed in one bag in the cabinet labeled, "patient belongings 16-18 Resus A."

## 2019-09-17 NOTE — Discharge Instructions (Addendum)
Follow-up at [facility] for further care for depression and bipolar disorder.

## 2019-09-17 NOTE — ED Notes (Signed)
Pt lying in bed calm and cooperative. Will continue to monitor.

## 2019-09-17 NOTE — ED Notes (Signed)
Pt sitting up in the bed. Pt calm and cooperative. Will continue to monitor.
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
## 2019-09-17 NOTE — ED Notes (Signed)
Pt lying in bed, eye's closed, chest rising and falling. NAD noted. Will continue to monitor.  

## 2019-09-17 NOTE — ED Provider Notes (Signed)
Emergency Medicine Observation Re-evaluation Note  Christopher Foley is a 24 y.o. male, seen on rounds today.  Pt initially presented to the ED for complaints of Suicidal and Drug Overdose Currently, the patient is calm and comfortable, resting in hallway bed.  He presented overnight for evaluation of suicidal ideation.  He is homeless and has history of depression.  He had a nontoxic ingestion of ibuprofen, without specific intent to hurt himself.  He has chronic psychiatric disorder, bipolar 1.  He has been seen by TTS, and they support discharging him home.  He plans to stay with his sister, who is receptive to that.  He was previously evaluated at the behavioral health observation unit and discharged, yesterday, with evaluation by social services and TTS.  Patient indicated to TTS that he was ready to go home.  Physical Exam  BP (!) 145/79 (BP Location: Left Arm)   Pulse 61   Temp 98.5 F (36.9 C) (Oral)   Resp 18   Ht 5\' 9"  (1.753 m)   Wt 104.8 kg   SpO2 96%   BMI 34.11 kg/m  Physical Exam   He is sitting on stretcher, alert and cooperative.  He denies suicidal ideation or plan.  He states that he is going to stay with his sister after he leaves here.  ED Course / MDM  EKG:    I have reviewed the labs performed to date as well as medications administered while in observation.  Recent changes in the last 24 hours include he has remained stable without progression of symptoms to hurt himself or depression. Plan  Current plan is for discharge with outpatient follow-up for mental health care. Patient is not under full IVC at this time.  Epic EMR indicates that the patient has a legal guardian.  His mother is , phone number [phone].  She states that she is not his legal guardian, anymore, since he has turned 23.  She last talked to him in December.  She thinks that he is still suicidal and needs to be given a shot, for psychiatry purposes, because he will not take his  medicines.  I informed the psychiatric provider, who saw him earlier today.   January, MD 09/17/19 [phone]

## 2019-09-17 NOTE — ED Notes (Signed)
Patient has eaten breakfast and lunch and has been calm and cooperative all morning. Patient has been reassessed by TTS and is awaiting plan of care. Will continue to monitor.

## 2019-09-24 ENCOUNTER — Encounter (HOSPITAL_COMMUNITY): Payer: Self-pay | Admitting: Emergency Medicine

## 2019-09-24 ENCOUNTER — Other Ambulatory Visit: Payer: Self-pay

## 2019-09-24 DIAGNOSIS — M791 Myalgia, unspecified site: Secondary | ICD-10-CM | POA: Diagnosis not present

## 2019-09-24 DIAGNOSIS — Z20822 Contact with and (suspected) exposure to covid-19: Secondary | ICD-10-CM | POA: Insufficient documentation

## 2019-09-24 DIAGNOSIS — F909 Attention-deficit hyperactivity disorder, unspecified type: Secondary | ICD-10-CM | POA: Insufficient documentation

## 2019-09-24 DIAGNOSIS — R519 Headache, unspecified: Secondary | ICD-10-CM | POA: Diagnosis not present

## 2019-09-24 NOTE — ED Triage Notes (Signed)
Patient complaining of headache since 2 am. Patient has not taken anything for it.

## 2019-09-25 ENCOUNTER — Emergency Department (HOSPITAL_COMMUNITY)
Admission: EM | Admit: 2019-09-25 | Discharge: 2019-09-25 | Disposition: A | Discharge status: Home / Self Care | Payer: Medicaid Other | Attending: Emergency Medicine | Admitting: Emergency Medicine

## 2019-09-25 DIAGNOSIS — R519 Headache, unspecified: Secondary | ICD-10-CM

## 2019-09-25 DIAGNOSIS — M791 Myalgia, unspecified site: Secondary | ICD-10-CM

## 2019-09-25 LAB — POC SARS CORONAVIRUS 2 AG -  ED: SARS Coronavirus 2 Ag: NEGATIVE

## 2019-09-25 MED ORDER — ACETAMINOPHEN 500 MG PO TABS
1000.0000 mg | ORAL_TABLET | Freq: Once | ORAL | Status: AC
  Administered 2019-09-25: 1000 mg via ORAL
  Filled 2019-09-25: qty 2

## 2019-09-25 MED ORDER — IBUPROFEN 800 MG PO TABS: 800.0000 mg | ORAL_TABLET | Freq: Once | ORAL | Status: DC

## 2019-09-25 MED ORDER — ONDANSETRON 4 MG PO TBDP
4.0000 mg | ORAL_TABLET | Freq: Once | ORAL | Status: AC
  Administered 2019-09-25: 4 mg via ORAL
  Filled 2019-09-25: qty 1

## 2019-09-25 NOTE — ED Provider Notes (Signed)
[facility]-EMERGENCY DEPT Provider Note   CSN: [id number] Arrival date & time: 09/24/19  2340     History Chief Complaint  Patient presents with  . Headache    Christopher Foley is a 24 y.o. male with a hx of Polar disorder, ADHD, depression, homelessness presents to the Emergency Department complaining of gradual, persistent, progressively worsening headache onset around 2 AM yesterday morning, 24 hours ago.  Patient reports he noticed a mild headache initially that has worsened throughout the day.  He reports it is generalized and throbbing.  He reports he intermittently has associated nausea and some light sensitivity.  He also reports generalized body aches and generally feeling unwell.  No treatments prior to arrival for any of the symptoms.  He denies numbness, tingling, weakness, loss of bowel or bladder control, syncope, chest pain, shortness of breath, cough.  He denies known sick contacts or known Covid contacts.  Patient reports he is living with his sister who is not ill.  Records reviewed.  Patient was evaluated on 09/17/2019 for nontoxic ingestion of ibuprofen without specific intent to hurt himself.  He was evaluated for suicidal ideation by TTS and discharged home.  He was noted to be homeless at that time and moved in with his sister at discharge.    The history is provided by the patient and medical records. No language interpreter was used.       Past Medical History:  Diagnosis Date  . Bipolar 1 disorder (HCC)   . History of ADHD     Patient Active Problem List   Diagnosis Date Noted  . Severe recurrent major depression without psychotic features (HCC) 09/16/2019  . Adjustment disorder with depressed mood 09/16/2019  . Homelessness 09/16/2019  . Conduct disorder, childhood-onset type 02/09/2013  . ADHD (attention deficit hyperactivity disorder), combined type 02/08/2013  . Bipolar 1 disorder, mixed, moderate (HCC) 01/15/2013    History  reviewed. No pertinent surgical history.     History reviewed. No pertinent family history.  Social History   Tobacco Use  . Smoking status: Never Smoker  . Smokeless tobacco: Never Used  Substance Use Topics  . Alcohol use: No  . Drug use: No    Home Medications Prior to Admission medications   Not on File    Allergies    Paliperidone er [paliperidone]  Review of Systems   Review of Systems  Constitutional: Positive for fatigue. Negative for appetite change, diaphoresis, fever and unexpected weight change.  HENT: Negative for congestion and mouth sores.   Eyes: Positive for photophobia.  Respiratory: Negative for cough, chest tightness, shortness of breath and wheezing.   Cardiovascular: Negative for chest pain.  Gastrointestinal: Positive for nausea. Negative for abdominal pain, constipation, diarrhea and vomiting.  Endocrine: Negative for polydipsia, polyphagia and polyuria.  Genitourinary: Negative for dysuria, frequency, hematuria and urgency.  Musculoskeletal: Positive for myalgias. Negative for back pain and neck stiffness.  Skin: Negative for rash.  Allergic/Immunologic: Negative for immunocompromised state.  Neurological: Positive for headaches. Negative for syncope and light-headedness.  Hematological: Does not bruise/bleed easily.  Psychiatric/Behavioral: Negative for sleep disturbance. The patient is not nervous/anxious.     Physical Exam Updated Vital Signs BP (!) 150/91 (BP Location: Right Arm)   Pulse 65   Temp 99 F (37.2 C) (Oral)   Resp 15   Ht 5\' 9"  (1.753 m)   Wt 102.1 kg   SpO2 100%   BMI 33.23 kg/m   Physical Exam Vitals and nursing note  reviewed.  Constitutional:      General: He is not in acute distress.    Appearance: He is well-developed. He is not diaphoretic.  HENT:     Head: Normocephalic and atraumatic.  Eyes:     General: No scleral icterus.    Conjunctiva/sclera: Conjunctivae normal.     Pupils: Pupils are equal, round,  and reactive to light.     Comments: No horizontal, vertical or rotational nystagmus  Neck:     Comments: Full active and passive ROM without pain No midline or paraspinal tenderness No nuchal rigidity or meningeal signs Cardiovascular:     Rate and Rhythm: Normal rate and regular rhythm.     Pulses:          Radial pulses are 2+ on the right side and 2+ on the left side.  Pulmonary:     Effort: Pulmonary effort is normal. No respiratory distress.  Abdominal:     Palpations: Abdomen is soft.     Tenderness: There is no abdominal tenderness. There is no guarding or rebound.  Musculoskeletal:        General: Normal range of motion.     Cervical back: Normal range of motion and neck supple.  Lymphadenopathy:     Cervical: No cervical adenopathy.  Skin:    General: Skin is warm and dry.     Findings: No rash.  Neurological:     Mental Status: He is alert and oriented to person, place, and time.     Cranial Nerves: No cranial nerve deficit.     Motor: No abnormal muscle tone.     Coordination: Coordination normal.     Comments: Mental Status:  Alert, oriented, thought content appropriate. Speech fluent without evidence of aphasia. Able to follow 2 step commands without difficulty.  Cranial Nerves:  II:  Peripheral visual fields grossly normal, pupils equal, round, reactive to light III,IV, VI: ptosis not present, extra-ocular motions intact bilaterally  V,VII: smile symmetric, facial light touch sensation equal VIII: hearing grossly normal bilaterally  IX,X: midline uvula rise  XI: bilateral shoulder shrug equal and strong XII: midline tongue extension  Motor:  5/5 in upper and lower extremities bilaterally including strong and equal grip strength and dorsiflexion/plantar flexion Sensory: light touch normal in all extremities.  Cerebellar: normal finger-to-nose with bilateral upper extremities; normal heel shin with bilateral lower extremities. Gait: normal gait and balance CV:  distal pulses palpable throughout   Psychiatric:        Behavior: Behavior normal.        Thought Content: Thought content normal.        Judgment: Judgment normal.     ED Results / Procedures / Treatments   Labs (all labs ordered are listed, but only abnormal results are displayed) Labs Reviewed  POC SARS CORONAVIRUS 2 AG -  ED    Procedures Procedures (including critical care time)  Medications Ordered in ED Medications  ondansetron (ZOFRAN-ODT) disintegrating tablet 4 mg (4 mg Oral Given 09/25/19 0226)  acetaminophen (TYLENOL) tablet 1,000 mg (1,000 mg Oral Given 09/25/19 0226)    ED Course  I have reviewed the triage vital signs and the nursing notes.  Pertinent labs & imaging results that were available during my care of the patient were reviewed by me and considered in my medical decision making (see chart for details).  Clinical Course as of Sep 24 324  Mon Sep 25, 2019  0323 Patient reports he feels significantly better and wishes for discharge  home.  His headache is resolved.   [HM]    Clinical Course User Index [HM] Victorina Kable, Boyd Kerbs   MDM Rules/Calculators/A&P                       Tiant Peixoto was evaluated in Emergency Department on 09/25/2019 for the symptoms described in the history of present illness. He was evaluated in the context of the global COVID-19 pandemic, which necessitated consideration that the patient might be at risk for infection with the SARS-CoV-2 virus that causes COVID-19. Institutional protocols and algorithms that pertain to the evaluation of patients at risk for COVID-19 are in a state of rapid change based on information released by regulatory bodies including the CDC and federal and state organizations. These policies and algorithms were followed during the patient's care in the ED.   Patient presents with 24 hours of headache.  It was not acute onset or thunderclap.  It is not associated with fever, neck pain or neck  stiffness.  Highly doubt meningitis.  Patient does have some other symptoms consistent with coronavirus.  Will test for Covid and give Tylenol for pain control as patient had nontoxic ingestion of ibuprofen last week.  3:26 AM Patient reports feeling better.  His headache has resolved.  Neurologically intact.  Rapid Covid test negative.  Discussed with patient possibility of Covid infection despite negative test or other viral syndrome causing headache.  Conservative therapies discussed and reasons to return to the emergency department.  Patient states understanding and is in agreement with the plan.   Final Clinical Impression(s) / ED Diagnoses Final diagnoses:  Bad headache  Myalgia    Rx / DC Orders ED Discharge Orders    None       Dae Antonucci, Boyd Kerbs 09/25/19 0326    Glynn Octave, MD 09/25/19 0730

## 2019-09-25 NOTE — Discharge Instructions (Addendum)
1. Medications: tylenol or ibuprofen for pain, usual home medications 2. Treatment: rest, drink plenty of fluids,  3. Follow Up: Please followup with your primary doctor in 2-3 days for discussion of your diagnoses and further evaluation after today's visit; if you do not have a primary care doctor use the resource guide provided to find one; Please return to the ER for new or worsening symptoms, fevers, persistent vomiting, numbness, weakness or other concerns.

## 2019-09-27 ENCOUNTER — Ambulatory Visit (HOSPITAL_COMMUNITY)
Admission: AD | Admit: 2019-09-27 | Discharge: 2019-09-27 | Disposition: A | Discharge status: Home / Self Care | Payer: Medicaid Other | Attending: Psychiatry | Admitting: Psychiatry

## 2019-09-27 DIAGNOSIS — F919 Conduct disorder, unspecified: Secondary | ICD-10-CM | POA: Diagnosis not present

## 2019-09-27 NOTE — H&P (Signed)
Behavioral Health Medical Screening Exam  Christopher Foley is an 24 y.o. male with history of IDD and conduct disorder. He was admitted for overnight observation one week ago and discharged to his sister's home at that time. He was kicked out of his sister's home today. He was putting up a tent outside and called 911 reporting suicidal ideation. He is irritable and uncooperative on assessment. He became acutely agitated with another patient in the hallway and is demanding to leave. He is able to be verbally deescalated by staff. He denies SI/HI/AVH. He shows no signs of responding to internal stimuli. He states he will call 911 if suicidal thoughts return. He is signing forms to leave AMA.  Total Time spent with patient: 15 minutes  Psychiatric Specialty Exam: Physical Exam  Nursing note and vitals reviewed. Constitutional: He is oriented to person, place, and time. He appears well-developed and well-nourished.  Cardiovascular: Normal rate.  Respiratory: Effort normal.  Neurological: He is alert and oriented to person, place, and time.    Review of Systems  Constitutional: Negative.   Respiratory: Negative for cough and shortness of breath.   Cardiovascular: Negative for chest pain.  Gastrointestinal: Negative for nausea and vomiting.  Psychiatric/Behavioral: Positive for agitation and dysphoric mood. Negative for behavioral problems, confusion, hallucinations, self-injury, sleep disturbance and suicidal ideas.    Blood pressure (!) 161/97, pulse 60, temperature 98.5 F (36.9 C), temperature source Oral, resp. rate 16, SpO2 99 %.There is no height or weight on file to calculate BMI.  General Appearance: Casual  Eye Contact:  Good  Speech:  Normal Rate  Volume:  Increased  Mood:  Irritable  Affect:  Congruent  Thought Process:  Coherent  Orientation:  Full (Time, Place, and Person)  Thought Content:  Logical  Suicidal Thoughts:  No  Homicidal Thoughts:  No  Memory:  Immediate;    Fair Recent;   Fair Remote;   Fair  Judgement:  Fair  Insight:  Lacking  Psychomotor Activity:  Normal  Concentration: Concentration: Fair and Attention Span: Fair  Recall:  [employer] of Knowledge:Fair  Language: Fair  Akathisia:  No  Handed:  Right  AIMS (if indicated):     Assets:  Communication Skills Leisure Time Physical Health  Sleep:       Musculoskeletal: Strength & Muscle Tone: within normal limits Gait & Station: normal Patient leans: N/A  Blood pressure (!) 161/97, pulse 60, temperature 98.5 F (36.9 C), temperature source Oral, resp. rate 16, SpO2 99 %.  Recommendations:  Based on my evaluation the patient does not appear to have an emergency medical condition. I have consulted with counselor and Dr. Lucianne Muss. Patient is signing out AMA and discharging from [facility]. He refuses outpatient and shelter resources.  Aldean Baker, NP 09/27/2019, 2:31 PM

## 2019-09-27 NOTE — Plan of Care (Signed)
Pt requested to leave, Dr. Lucianne Muss saw pt on 200s unit, is aware and agreed to have pt sign form and discharge pt from obs unit. Pt signed a Psychiatric Services Request for discharge form, denies suicidal and homicidal ideation, is alert and oriented to person, place, time and situation. Denies hallucinations. Pt given all personal belongings upon leaving.
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
## 2019-11-25 ENCOUNTER — Ambulatory Visit (HOSPITAL_COMMUNITY)
Admission: AD | Admit: 2019-11-25 | Discharge: 2019-11-25 | Disposition: A | Discharge status: Home / Self Care | Payer: Medicaid Other | Attending: Psychiatry | Admitting: Psychiatry

## 2019-11-25 DIAGNOSIS — F419 Anxiety disorder, unspecified: Secondary | ICD-10-CM | POA: Diagnosis present

## 2019-11-25 DIAGNOSIS — F919 Conduct disorder, unspecified: Secondary | ICD-10-CM | POA: Insufficient documentation

## 2019-11-25 DIAGNOSIS — Z590 Homelessness: Secondary | ICD-10-CM | POA: Diagnosis not present
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
